# Patient Record
Sex: Female | Born: 1975
Health system: Southern US, Community
[De-identification: ages and names within clinical notes are randomized; demographics above are authoritative.]

## PROBLEM LIST (undated history)

## (undated) DIAGNOSIS — N7011 Chronic salpingitis: Secondary | ICD-10-CM

## (undated) DIAGNOSIS — F419 Anxiety disorder, unspecified: Secondary | ICD-10-CM

## (undated) DIAGNOSIS — K649 Unspecified hemorrhoids: Secondary | ICD-10-CM

## (undated) HISTORY — DX: Anxiety disorder, unspecified: F41.9

---

## 2004-08-14 HISTORY — PX: LEEP: SHX91

## 2006-06-17 ENCOUNTER — Emergency Department: Payer: Self-pay | Admitting: Emergency Medicine

## 2006-06-17 ENCOUNTER — Other Ambulatory Visit: Payer: Self-pay

## 2006-06-19 ENCOUNTER — Ambulatory Visit: Payer: Self-pay | Admitting: Internal Medicine

## 2006-06-20 ENCOUNTER — Ambulatory Visit: Payer: Self-pay | Admitting: Internal Medicine

## 2008-01-31 ENCOUNTER — Emergency Department: Payer: Self-pay | Admitting: Emergency Medicine

## 2012-11-17 ENCOUNTER — Ambulatory Visit: Payer: Self-pay

## 2012-11-17 LAB — CBC WITH DIFFERENTIAL/PLATELET
Basophil #: 0 10*3/uL (ref 0.0–0.1)
Eosinophil %: 0 %
HGB: 14 g/dL (ref 12.0–16.0)
Lymphocyte #: 0.4 10*3/uL — ABNORMAL LOW (ref 1.0–3.6)
Monocyte %: 2.9 %
Neutrophil #: 11.7 10*3/uL — ABNORMAL HIGH (ref 1.4–6.5)
RBC: 4.63 10*6/uL (ref 3.80–5.20)
WBC: 12.5 10*3/uL — ABNORMAL HIGH (ref 3.6–11.0)

## 2012-11-17 LAB — URINALYSIS, COMPLETE
Blood: NEGATIVE
Leukocyte Esterase: NEGATIVE
Nitrite: NEGATIVE
Ph: 6 (ref 4.5–8.0)
RBC,UR: NONE SEEN /HPF (ref 0–5)
Specific Gravity: >=1.03 (ref 1.003–1.030)

## 2012-11-17 LAB — COMPREHENSIVE METABOLIC PANEL
Albumin: 3.9 g/dL (ref 3.4–5.0)
Alkaline Phosphatase: 83 U/L (ref 50–136)
BUN: 18 mg/dL (ref 7–18)
Bilirubin,Total: 0.8 mg/dL (ref 0.2–1.0)
Calcium, Total: 8.4 mg/dL — ABNORMAL LOW (ref 8.5–10.1)
Co2: 27 mmol/L (ref 21–32)
Creatinine: 0.97 mg/dL (ref 0.60–1.30)
EGFR (Non-African Amer.): >60
Osmolality: 281 (ref 275–301)
Potassium: 3.2 mmol/L — ABNORMAL LOW (ref 3.5–5.1)
SGOT(AST): 24 U/L (ref 15–37)
SGPT (ALT): 24 U/L (ref 12–78)
Sodium: 140 mmol/L (ref 136–145)

## 2013-08-16 ENCOUNTER — Ambulatory Visit: Payer: Self-pay

## 2013-08-16 LAB — RAPID STREP-A WITH REFLX: Micro Text Report: NEGATIVE

## 2013-08-16 LAB — RAPID INFLUENZA A&B ANTIGENS

## 2013-08-19 LAB — BETA STREP CULTURE(ARMC)

## 2016-08-14 DIAGNOSIS — Z3183 Encounter for assisted reproductive fertility procedure cycle: Secondary | ICD-10-CM

## 2016-08-14 HISTORY — DX: Encounter for assisted reproductive fertility procedure cycle: Z31.83

## 2016-08-23 DIAGNOSIS — N7011 Chronic salpingitis: Secondary | ICD-10-CM | POA: Insufficient documentation

## 2016-09-06 DIAGNOSIS — M76821 Posterior tibial tendinitis, right leg: Secondary | ICD-10-CM | POA: Insufficient documentation

## 2016-09-06 DIAGNOSIS — M76822 Posterior tibial tendinitis, left leg: Secondary | ICD-10-CM

## 2016-12-12 HISTORY — PX: TUBAL LIGATION: SHX77

## 2016-12-12 HISTORY — PX: OTHER SURGICAL HISTORY: SHX169

## 2017-10-22 DIAGNOSIS — O09529 Supervision of elderly multigravida, unspecified trimester: Secondary | ICD-10-CM | POA: Diagnosis not present

## 2017-10-22 DIAGNOSIS — O200 Threatened abortion: Secondary | ICD-10-CM | POA: Diagnosis not present

## 2017-10-29 DIAGNOSIS — O0990 Supervision of high risk pregnancy, unspecified, unspecified trimester: Secondary | ICD-10-CM | POA: Diagnosis not present

## 2017-11-07 DIAGNOSIS — O021 Missed abortion: Secondary | ICD-10-CM | POA: Diagnosis not present

## 2017-11-19 DIAGNOSIS — Z6832 Body mass index (BMI) 32.0-32.9, adult: Secondary | ICD-10-CM | POA: Diagnosis not present

## 2017-11-19 DIAGNOSIS — O26851 Spotting complicating pregnancy, first trimester: Secondary | ICD-10-CM | POA: Diagnosis not present

## 2017-11-19 DIAGNOSIS — O09521 Supervision of elderly multigravida, first trimester: Secondary | ICD-10-CM | POA: Diagnosis not present

## 2017-11-19 DIAGNOSIS — O021 Missed abortion: Secondary | ICD-10-CM | POA: Diagnosis not present

## 2017-11-19 DIAGNOSIS — Z880 Allergy status to penicillin: Secondary | ICD-10-CM | POA: Diagnosis not present

## 2017-11-19 DIAGNOSIS — Z3A10 10 weeks gestation of pregnancy: Secondary | ICD-10-CM | POA: Diagnosis not present

## 2017-11-19 DIAGNOSIS — O039 Complete or unspecified spontaneous abortion without complication: Secondary | ICD-10-CM | POA: Diagnosis not present

## 2017-11-21 ENCOUNTER — Encounter: Payer: Self-pay | Admitting: Obstetrics and Gynecology

## 2017-12-07 ENCOUNTER — Encounter: Payer: Self-pay | Admitting: Obstetrics and Gynecology

## 2018-01-22 ENCOUNTER — Ambulatory Visit: Payer: 59 | Admitting: Internal Medicine

## 2018-01-22 ENCOUNTER — Encounter: Payer: Self-pay | Admitting: Internal Medicine

## 2018-01-22 VITALS — BP 100/76 | HR 83 | Resp 16 | Ht 62.0 in | Wt 178.2 lb

## 2018-01-22 DIAGNOSIS — Z124 Encounter for screening for malignant neoplasm of cervix: Secondary | ICD-10-CM

## 2018-01-22 DIAGNOSIS — Z319 Encounter for procreative management, unspecified: Secondary | ICD-10-CM

## 2018-01-22 DIAGNOSIS — F411 Generalized anxiety disorder: Secondary | ICD-10-CM | POA: Diagnosis not present

## 2018-01-22 DIAGNOSIS — Z0001 Encounter for general adult medical examination with abnormal findings: Secondary | ICD-10-CM

## 2018-01-22 DIAGNOSIS — F419 Anxiety disorder, unspecified: Secondary | ICD-10-CM | POA: Insufficient documentation

## 2018-01-22 DIAGNOSIS — Z1231 Encounter for screening mammogram for malignant neoplasm of breast: Secondary | ICD-10-CM | POA: Diagnosis not present

## 2018-01-22 DIAGNOSIS — R300 Dysuria: Secondary | ICD-10-CM

## 2018-01-22 DIAGNOSIS — Z1239 Encounter for other screening for malignant neoplasm of breast: Secondary | ICD-10-CM

## 2018-01-22 DIAGNOSIS — F410 Panic disorder [episodic paroxysmal anxiety] without agoraphobia: Secondary | ICD-10-CM | POA: Diagnosis not present

## 2018-01-22 NOTE — Progress Notes (Signed)
[facility] [place], [place] [address]  Internal MEDICINE  Office Visit Note  Patient Name: Emily Blake  [id number]  [id number]  Date of Service: 01/22/2018  Chief Complaint  Patient presents with  . Annual Exam    with pap smear   HPI Pt is here for routine health maintenance examination. She has been feeling well. Has baseline anxiety. She is trying to conceive through IVF. She did have miscarriage twice. Takes MOV. Has a healthy life style but stress at work   Current Medication: No outpatient encounter medications on file as of 01/22/2018.   No facility-administered encounter medications on file as of 01/22/2018.    Surgical History: Past Surgical History:  Procedure Laterality Date  . tubiligation  12/2016   Medical History: Past Medical History:  Diagnosis Date  . Anxiety    Family History: Family History  Problem Relation Age of Onset  . Diabetes Mother   . Hypertension Mother   . Hypertension Father   . Hypothyroidism Father    Review of Systems  Constitutional: Negative for chills, diaphoresis, fever, malaise/fatigue and weight loss.  HENT: Negative for nosebleeds and sinus pain.   Eyes: Negative for blurred vision, photophobia and pain.  Respiratory: Negative for hemoptysis, shortness of breath and wheezing.   Cardiovascular: Negative for chest pain, palpitations, orthopnea and leg swelling.  Gastrointestinal: Negative for abdominal pain, constipation, diarrhea and nausea.  Genitourinary: Negative for dysuria, frequency and hematuria.  Musculoskeletal: Negative for back pain, joint pain and myalgias.  Skin: Negative for itching and rash.  Neurological: Positive for headaches. Negative for tremors, sensory change and weakness.  Endo/Heme/Allergies: Negative for environmental allergies. Does not bruise/bleed easily.  Psychiatric/Behavioral: Negative for depression. The patient is nervous/anxious and has insomnia.    Vital  Signs: BP 100/76 (BP Location: Left Arm, Patient Position: Sitting)   Pulse 83   Resp 16   Ht 5\' 2"  (1.575 m)   Wt 178 lb 3.2 oz (80.8 kg)   SpO2 99%   BMI 32.59 kg/m   Physical Exam  Constitutional: She is oriented to person, place, and time. She appears well-developed and well-nourished. No distress.  HENT:  Head: Normocephalic and atraumatic.  Mouth/Throat: Oropharynx is clear and moist. No oropharyngeal exudate.  Eyes: Pupils are equal, round, and reactive to light. EOM are normal.  Neck: Normal range of motion. Neck supple. No JVD present. No tracheal deviation present. No thyromegaly present.  Cardiovascular: Normal rate, regular rhythm and normal heart sounds. Exam reveals no gallop and no friction rub.  No murmur heard. Pulmonary/Chest: Effort normal. No respiratory distress. She has no wheezes. She has no rales. She exhibits no tenderness. Right breast exhibits no inverted nipple and no mass. Left breast exhibits no inverted nipple and no mass. Breasts are symmetrical.    Abdominal: Soft. Bowel sounds are normal.  Genitourinary: Vagina normal and uterus normal.  Genitourinary Comments: Fragile cervical mucosa  Musculoskeletal: Normal range of motion.  Lymphadenopathy:    She has no cervical adenopathy.  Neurological: She is alert and oriented to person, place, and time. No cranial nerve deficit.  Skin: Skin is warm and dry. She is not diaphoretic.  Psychiatric: She has a normal mood and affect. Her behavior is normal. Judgment and thought content normal.   Assessment/Plan:  1. Encounter for general adult medical examination with abnormal findings Update all labs  - CBC with Differential/Platelet - Lipid Panel With LDL/HDL Ratio - TSH - T4, free - Comprehensive metabolic  panel  2. Infertility management Ordered  - Anti mullerian hormone - Prolactin  3. Generalized anxiety disorder with panic attacks Continue xanax prn   4. Dysuria - Urinalysis, Routine w  reflex microscopic  5. Screening for malignant neoplasm of cervix - Pap IG and HPV (high risk) DNA detection  6. Screening breast examination - MM DIGITAL SCREENING BILATERAL; Future  General Counseling: Emily Blake verbalizes understanding of the findings of todays visit and agrees with plan of treatment. I have discussed any further diagnostic evaluation that may be needed or ordered today. We also reviewed her medications today. she has been encouraged to call the office with any questions or concerns that should arise related to todays visit.  Orders Placed This Encounter  Procedures  . Urinalysis, Routine w reflex microscopic  . CBC with Differential/Platelet  . Lipid Panel With LDL/HDL Ratio  . TSH  . T4, free  . Comprehensive metabolic panel  . Anti mullerian hormone  . Prolactin     Time spent:20Minutes  Lavera Guise, MD  Internal Medicine

## 2018-01-23 ENCOUNTER — Encounter: Payer: Self-pay | Admitting: Internal Medicine

## 2018-01-23 DIAGNOSIS — F410 Panic disorder [episodic paroxysmal anxiety] without agoraphobia: Secondary | ICD-10-CM | POA: Diagnosis not present

## 2018-01-23 LAB — URINALYSIS, ROUTINE W REFLEX MICROSCOPIC
Bilirubin, UA: NEGATIVE
GLUCOSE, UA: NEGATIVE
Ketones, UA: NEGATIVE
Leukocytes, UA: NEGATIVE
Nitrite, UA: NEGATIVE
Protein, UA: NEGATIVE
RBC UA: NEGATIVE
Specific Gravity, UA: 1.021 (ref 1.005–1.030)
UUROB: 0.2 mg/dL (ref 0.2–1.0)
pH, UA: 8 — ABNORMAL HIGH (ref 5.0–7.5)

## 2018-01-23 MED FILL — ALPRAZolam 0.5 MG TABS: 0.5 | 7 days supply | Qty: 7 | Fill #0

## 2018-01-24 ENCOUNTER — Other Ambulatory Visit: Payer: Self-pay

## 2018-01-24 ENCOUNTER — Telehealth: Payer: Self-pay

## 2018-01-24 DIAGNOSIS — Z319 Encounter for procreative management, unspecified: Secondary | ICD-10-CM | POA: Diagnosis not present

## 2018-01-24 DIAGNOSIS — Z0001 Encounter for general adult medical examination with abnormal findings: Secondary | ICD-10-CM | POA: Diagnosis not present

## 2018-01-24 LAB — PAP IG AND HPV HIGH-RISK
HPV, high-risk: POSITIVE — AB
PAP SMEAR COMMENT: 0

## 2018-01-24 MED ORDER — ALPRAZOLAM 0.25 MG PO TABS: ORAL_TABLET | ORAL | 1 refills | Status: DC

## 2018-01-24 NOTE — Telephone Encounter (Signed)
Called in phar alprazolam 0.25mg  take 1 tab po daily prn 30 wirth 1 as per dr Humphrey Rolls and also advised pt that we send xanax /np

## 2018-01-28 LAB — CBC WITH DIFFERENTIAL/PLATELET
Basophils Absolute: 0 10*3/uL (ref 0.0–0.2)
Basos: 0 %
EOS (ABSOLUTE): 0.2 10*3/uL (ref 0.0–0.4)
EOS: 2 %
HEMATOCRIT: 40.7 % (ref 34.0–46.6)
Hemoglobin: 14 g/dL (ref 11.1–15.9)
IMMATURE GRANULOCYTES: 0 %
Immature Grans (Abs): 0 10*3/uL (ref 0.0–0.1)
LYMPHS ABS: 2.5 10*3/uL (ref 0.7–3.1)
Lymphs: 33 %
MCH: 30.7 pg (ref 26.6–33.0)
MCHC: 34.4 g/dL (ref 31.5–35.7)
MCV: 89 fL (ref 79–97)
MONOS ABS: 0.6 10*3/uL (ref 0.1–0.9)
Monocytes: 8 %
Neutrophils Absolute: 4.2 10*3/uL (ref 1.4–7.0)
Neutrophils: 57 %
PLATELETS: 261 10*3/uL (ref 150–450)
RBC: 4.56 x10E6/uL (ref 3.77–5.28)
RDW: 12.7 % (ref 12.3–15.4)
WBC: 7.5 10*3/uL (ref 3.4–10.8)

## 2018-01-28 LAB — COMPREHENSIVE METABOLIC PANEL
ALBUMIN: 4.4 g/dL (ref 3.5–5.5)
ALK PHOS: 86 IU/L (ref 39–117)
ALT: 15 IU/L (ref 0–32)
AST: 15 IU/L (ref 0–40)
Albumin/Globulin Ratio: 1.8 (ref 1.2–2.2)
BILIRUBIN TOTAL: 0.5 mg/dL (ref 0.0–1.2)
BUN/Creatinine Ratio: 12 (ref 9–23)
BUN: 12 mg/dL (ref 6–24)
CHLORIDE: 104 mmol/L (ref 96–106)
CO2: 23 mmol/L (ref 20–29)
CREATININE: 0.99 mg/dL (ref 0.57–1.00)
Calcium: 9.5 mg/dL (ref 8.7–10.2)
GFR calc Af Amer: 82 mL/min/{1.73_m2} (ref >59)
GFR calc non Af Amer: 72 mL/min/{1.73_m2} (ref >59)
GLUCOSE: 83 mg/dL (ref 65–99)
Globulin, Total: 2.4 g/dL (ref 1.5–4.5)
Potassium: 4.5 mmol/L (ref 3.5–5.2)
Sodium: 141 mmol/L (ref 134–144)
Total Protein: 6.8 g/dL (ref 6.0–8.5)

## 2018-01-28 LAB — T4, FREE: FREE T4: 1.01 ng/dL (ref 0.82–1.77)

## 2018-01-28 LAB — PROLACTIN: PROLACTIN: 21.6 ng/mL (ref 4.8–23.3)

## 2018-01-28 LAB — LIPID PANEL WITH LDL/HDL RATIO
Cholesterol, Total: 186 mg/dL (ref 100–199)
HDL: 67 mg/dL (ref >39)
LDL Calculated: 96 mg/dL (ref 0–99)
LDL/HDL RATIO: 1.4 ratio (ref 0.0–3.2)
Triglycerides: 114 mg/dL (ref 0–149)
VLDL Cholesterol Cal: 23 mg/dL (ref 5–40)

## 2018-01-28 LAB — ANTI MULLERIAN HORMONE: ANTI-MULLERIAN HORMONE (AMH): 2.07 ng/mL

## 2018-01-28 LAB — TSH: TSH: 2.24 u[IU]/mL (ref 0.450–4.500)

## 2018-02-25 DIAGNOSIS — Z319 Encounter for procreative management, unspecified: Secondary | ICD-10-CM | POA: Diagnosis not present

## 2018-02-25 DIAGNOSIS — N971 Female infertility of tubal origin: Secondary | ICD-10-CM | POA: Diagnosis not present

## 2018-02-25 DIAGNOSIS — E288 Other ovarian dysfunction: Secondary | ICD-10-CM | POA: Diagnosis not present

## 2018-03-01 DIAGNOSIS — E288 Other ovarian dysfunction: Secondary | ICD-10-CM | POA: Diagnosis not present

## 2018-03-11 DIAGNOSIS — N971 Female infertility of tubal origin: Secondary | ICD-10-CM | POA: Diagnosis not present

## 2018-03-11 DIAGNOSIS — Z3141 Encounter for fertility testing: Secondary | ICD-10-CM | POA: Diagnosis not present

## 2018-03-11 DIAGNOSIS — E288 Other ovarian dysfunction: Secondary | ICD-10-CM | POA: Diagnosis not present

## 2018-03-12 DIAGNOSIS — E288 Other ovarian dysfunction: Secondary | ICD-10-CM | POA: Diagnosis not present

## 2018-03-12 DIAGNOSIS — N971 Female infertility of tubal origin: Secondary | ICD-10-CM | POA: Diagnosis not present

## 2018-03-12 DIAGNOSIS — Z319 Encounter for procreative management, unspecified: Secondary | ICD-10-CM | POA: Diagnosis not present

## 2018-03-12 DIAGNOSIS — Z113 Encounter for screening for infections with a predominantly sexual mode of transmission: Secondary | ICD-10-CM | POA: Diagnosis not present

## 2018-03-12 DIAGNOSIS — Z31430 Encounter of female for testing for genetic disease carrier status for procreative management: Secondary | ICD-10-CM | POA: Diagnosis not present

## 2018-03-14 MED FILL — BD 3 ML SYRINGE 18GX1-1/2: 18G X 1-1/2 | 30 days supply | Qty: 60 | Fill #0

## 2018-03-14 MED FILL — NOVAREL 5000 UNIT SOLR: 5000 | 1 days supply | Qty: 2 | Fill #0

## 2018-03-14 MED FILL — BD 3 ML SYRINGE 18GX1-1/2": 18G X 1-1/2 | 30 days supply | Qty: 60 | Fill #0

## 2018-03-14 MED FILL — MENOPUR 75 UNIT VIAL: 75 | 13 days supply | Qty: 25 | Fill #0

## 2018-03-14 MED FILL — GONAL-F 1,050 UNITS VIAL: 1050 | 11 days supply | Qty: 3 | Fill #0

## 2018-03-14 MED FILL — CETROTIDE 0.25 MG KIT: 0.25 | 5 days supply | Qty: 5 | Fill #0

## 2018-03-14 MED FILL — BD NEEDLES 30GX0.5": 30G X 1/2" | 30 days supply | Qty: 30 | Fill #0

## 2018-03-14 MED FILL — ESTRADIOL 0.1 MG PATCH: 0.1 | 30 days supply | Qty: 8 | Fill #0

## 2018-03-14 MED FILL — BD NEEDLES 22GX1.5: 22G X 1-1/2 | 30 days supply | Qty: 30 | Fill #0

## 2018-03-14 MED FILL — DOXYCYCLINE HYCLATE 100 MG: 100 | 20 days supply | Qty: 40 | Fill #0

## 2018-03-14 MED FILL — PROGESTERONE OIL 50 MG/ML V: 50 | 30 days supply | Qty: 30 | Fill #0

## 2018-03-14 MED FILL — SHARPS COLLECTOR 1.4QT: 1 days supply | Qty: 1 | Fill #0

## 2018-03-14 MED FILL — METHYLPREDNISOLONE 4 MG TAB: 4 | 4 days supply | Qty: 16 | Fill #0

## 2018-03-14 MED FILL — ESTRADIOL 2 MG TABLET: 2 | 30 days supply | Qty: 60 | Fill #0

## 2018-03-14 MED FILL — ULTICARE ALCOHOL SWABS PADS: 30 days supply | Qty: 100 | Fill #0

## 2018-03-14 MED FILL — BD NEEDLES 22GX1.5": 22G X 1-1/2 | 30 days supply | Qty: 30 | Fill #0

## 2018-03-14 MED FILL — BD NEEDLES 30GX0.5: 30G X 1/2" | 30 days supply | Qty: 30 | Fill #0

## 2018-03-22 DIAGNOSIS — Z3183 Encounter for assisted reproductive fertility procedure cycle: Secondary | ICD-10-CM | POA: Diagnosis not present

## 2018-03-26 ENCOUNTER — Emergency Department (HOSPITAL_BASED_OUTPATIENT_CLINIC_OR_DEPARTMENT_OTHER): Payer: 59

## 2018-03-26 ENCOUNTER — Other Ambulatory Visit: Payer: Self-pay

## 2018-03-26 ENCOUNTER — Emergency Department (HOSPITAL_BASED_OUTPATIENT_CLINIC_OR_DEPARTMENT_OTHER)
Admission: EM | Admit: 2018-03-26 | Discharge: 2018-03-26 | Disposition: A | Discharge status: Home / Self Care | Payer: 59 | Attending: Emergency Medicine | Admitting: Emergency Medicine

## 2018-03-26 ENCOUNTER — Encounter (HOSPITAL_BASED_OUTPATIENT_CLINIC_OR_DEPARTMENT_OTHER): Payer: Self-pay | Admitting: Emergency Medicine

## 2018-03-26 DIAGNOSIS — R1031 Right lower quadrant pain: Secondary | ICD-10-CM | POA: Insufficient documentation

## 2018-03-26 DIAGNOSIS — R112 Nausea with vomiting, unspecified: Secondary | ICD-10-CM | POA: Diagnosis not present

## 2018-03-26 DIAGNOSIS — R1110 Vomiting, unspecified: Secondary | ICD-10-CM | POA: Diagnosis not present

## 2018-03-26 HISTORY — DX: Chronic salpingitis: N70.11

## 2018-03-26 LAB — COMPREHENSIVE METABOLIC PANEL
ALT: 16 U/L (ref 0–44)
AST: 19 U/L (ref 15–41)
Albumin: 4.1 g/dL (ref 3.5–5.0)
Alkaline Phosphatase: 68 U/L (ref 38–126)
Anion gap: 8 (ref 5–15)
BILIRUBIN TOTAL: 0.5 mg/dL (ref 0.3–1.2)
BUN: 13 mg/dL (ref 6–20)
CALCIUM: 8.6 mg/dL — AB (ref 8.9–10.3)
CHLORIDE: 103 mmol/L (ref 98–111)
CO2: 26 mmol/L (ref 22–32)
Creatinine, Ser: 0.82 mg/dL (ref 0.44–1.00)
Glucose, Bld: 95 mg/dL (ref 70–99)
Potassium: 3.9 mmol/L (ref 3.5–5.1)
Sodium: 137 mmol/L (ref 135–145)
TOTAL PROTEIN: 7.1 g/dL (ref 6.5–8.1)

## 2018-03-26 LAB — CBC WITH DIFFERENTIAL/PLATELET
BASOS PCT: 0 %
Basophils Absolute: 0 10*3/uL (ref 0.0–0.1)
EOS ABS: 0 10*3/uL (ref 0.0–0.7)
EOS PCT: 0 %
HCT: 40.3 % (ref 36.0–46.0)
Hemoglobin: 14.1 g/dL (ref 12.0–15.0)
LYMPHS PCT: 12 %
Lymphs Abs: 1.1 10*3/uL (ref 0.7–4.0)
MCH: 31.1 pg (ref 26.0–34.0)
MCHC: 35 g/dL (ref 30.0–36.0)
MCV: 89 fL (ref 78.0–100.0)
MONO ABS: 0.5 10*3/uL (ref 0.1–1.0)
Monocytes Relative: 5 %
Neutro Abs: 7.9 10*3/uL — ABNORMAL HIGH (ref 1.7–7.7)
Neutrophils Relative %: 83 %
PLATELETS: 218 10*3/uL (ref 150–400)
RBC: 4.53 MIL/uL (ref 3.87–5.11)
RDW: 11.9 % (ref 11.5–15.5)
WBC: 9.5 10*3/uL (ref 4.0–10.5)

## 2018-03-26 LAB — URINALYSIS, ROUTINE W REFLEX MICROSCOPIC
BILIRUBIN URINE: NEGATIVE
GLUCOSE, UA: NEGATIVE mg/dL
Ketones, ur: 40 mg/dL — AB
Leukocytes, UA: NEGATIVE
Nitrite: NEGATIVE
Protein, ur: NEGATIVE mg/dL
Specific Gravity, Urine: <1.005 — ABNORMAL LOW (ref 1.005–1.030)
pH: 7 (ref 5.0–8.0)

## 2018-03-26 LAB — URINALYSIS, MICROSCOPIC (REFLEX)

## 2018-03-26 LAB — LIPASE, BLOOD: LIPASE: 30 U/L (ref 11–51)

## 2018-03-26 LAB — PREGNANCY, URINE: PREG TEST UR: NEGATIVE

## 2018-03-26 MED ORDER — KETOROLAC TROMETHAMINE 30 MG/ML IJ SOLN
30.0000 mg | Freq: Once | INTRAMUSCULAR | Status: AC
  Administered 2018-03-26: 30 mg via INTRAVENOUS
  Filled 2018-03-26: qty 1

## 2018-03-26 MED ORDER — SODIUM CHLORIDE 0.9 % IV BOLUS
500.0000 mL | Freq: Once | INTRAVENOUS | Status: AC
  Administered 2018-03-26: 500 mL via INTRAVENOUS

## 2018-03-26 MED ORDER — ONDANSETRON HCL 4 MG/2ML IJ SOLN
4.0000 mg | Freq: Once | INTRAMUSCULAR | Status: AC
  Administered 2018-03-26: 4 mg via INTRAVENOUS
  Filled 2018-03-26: qty 2

## 2018-03-26 MED ORDER — IOPAMIDOL (ISOVUE-300) INJECTION 61%
100.0000 mL | Freq: Once | INTRAVENOUS | Status: AC | PRN
  Administered 2018-03-26: 100 mL via INTRAVENOUS

## 2018-03-26 MED ORDER — TRAMADOL HCL 50 MG PO TABS: 50.0000 mg | ORAL_TABLET | Freq: Four times a day (QID) | ORAL | 0 refills | Status: DC | PRN

## 2018-03-26 MED FILL — traMADol HCL 50 MG TABS: 50 | 3 days supply | Qty: 12 | Fill #0

## 2018-03-26 NOTE — ED Triage Notes (Signed)
RLQ abdominal pain since this am.  Some N/V.  Pt states she is about to start her period but pain is sharp and feels different.

## 2018-03-26 NOTE — ED Provider Notes (Signed)
Emergency Department Provider Note   I have reviewed the triage vital signs and the nursing notes.   HISTORY  Chief Complaint Abdominal Pain   HPI Lezli D Krupka is a 42 y.o. female with PMH of hydrosalpinx s/p tubal ligation presents to the ED with severe right-sided abdominal pain which began this morning.  Patient states the pain began more toward the middle and is since moved more to the right lower quadrant.  She woke up this morning and felt like she would feel better after a bowel movement but did not.  She had some loose stool but not exactly diarrhea.  Shortly after the bowel movement she began having multiple episodes of watery, nonbloody vomiting.  She denies any fevers or chills.  She states that she is due to start her menstrual cycle but this feels very atypical for her menstrual cycle pain.  Pain is slightly improved with pressing in the area.  No other modifying factors.  Past Medical History:  Diagnosis Date  . Anxiety   . Hydrosalpinx     Patient Active Problem List   Diagnosis Date Noted  . Anxiety 01/22/2018  . Posterior tibial tendon dysfunction (PTTD) of both lower extremities 09/06/2016  . Hydrosalpinx 08/23/2016    Past Surgical History:  Procedure Laterality Date  . tubiligation  12/2016    Allergies Amoxicillin  Family History  Problem Relation Age of Onset  . Diabetes Mother   . Hypertension Mother   . Hypertension Father   . Hypothyroidism Father     Social History Social History   Tobacco Use  . Smoking status: Never Smoker  . Smokeless tobacco: Never Used  Substance Use Topics  . Alcohol use: Not Currently    Frequency: Never  . Drug use: Not Currently    Review of Systems  Constitutional: No fever/chills Eyes: No visual changes. ENT: No sore throat. Cardiovascular: Denies chest pain. Respiratory: Denies shortness of breath. Gastrointestinal: Positive RLQ abdominal pain.  No nausea, no vomiting.  No diarrhea.  No  constipation. Genitourinary: Negative for dysuria. Musculoskeletal: Negative for back pain. Skin: Negative for rash. Neurological: Negative for headaches, focal weakness or numbness.  10-point ROS otherwise negative.  ____________________________________________   PHYSICAL EXAM:  VITAL SIGNS: ED Triage Vitals  Enc Vitals Group     BP 03/26/18 1050 (!) 125/94     Pulse Rate 03/26/18 1050 74     Resp 03/26/18 1050 18     Temp 03/26/18 1050 98.2 F (36.8 C)     Temp Source 03/26/18 1050 Oral     SpO2 03/26/18 1050 100 %     Weight 03/26/18 1050 175 lb (79.4 kg)     Height 03/26/18 1050 5\' 2"  (1.575 m)     Pain Score 03/26/18 1115 8   Constitutional: Alert and oriented. Well appearing and in no acute distress. Eyes: Conjunctivae are normal.  Head: Atraumatic. Nose: No congestion/rhinnorhea. Mouth/Throat: Mucous membranes are moist.  Oropharynx non-erythematous. Neck: No stridor.  Cardiovascular: Normal rate, regular rhythm. Good peripheral circulation. Grossly normal heart sounds.   Respiratory: Normal respiratory effort.  No retractions. Lungs CTAB. Gastrointestinal: Soft with focal RLQ tenderness. No rebound or guarding. No distention.  Musculoskeletal: No lower extremity tenderness nor edema. No gross deformities of extremities. Neurologic:  Normal speech and language. No gross focal neurologic deficits are appreciated.  Skin:  Skin is warm, dry and intact. No rash noted.  ____________________________________________   LABS (all labs ordered are listed, but only abnormal results  are displayed)  Labs Reviewed  URINALYSIS, ROUTINE W REFLEX MICROSCOPIC - Abnormal; Notable for the following components:      Result Value   Specific Gravity, Urine <1.005 (*)    Hgb urine dipstick TRACE (*)    Ketones, ur 40 (*)    All other components within normal limits  COMPREHENSIVE METABOLIC PANEL - Abnormal; Notable for the following components:   Calcium 8.6 (*)    All other  components within normal limits  CBC WITH DIFFERENTIAL/PLATELET - Abnormal; Notable for the following components:   Neutro Abs 7.9 (*)    All other components within normal limits  URINALYSIS, MICROSCOPIC (REFLEX) - Abnormal; Notable for the following components:   Bacteria, UA RARE (*)    All other components within normal limits  PREGNANCY, URINE  LIPASE, BLOOD   ____________________________________________  RADIOLOGY  Ct Abdomen Pelvis W Contrast  Result Date: 03/26/2018 CLINICAL DATA:  Right lower quadrant pain with nausea vomiting. EXAM: CT ABDOMEN AND PELVIS WITH CONTRAST TECHNIQUE: Multidetector CT imaging of the abdomen and pelvis was performed using the standard protocol following bolus administration of intravenous contrast. CONTRAST:  145mL ISOVUE-300 IOPAMIDOL (ISOVUE-300) INJECTION 61% COMPARISON:  None. FINDINGS: Lower chest: Unremarkable. Hepatobiliary: Small area of low attenuation in the anterior liver, adjacent to the falciform ligament, is in a characteristic location for focal fatty deposition. There is no evidence for gallstones, gallbladder wall thickening, or pericholecystic fluid. No intrahepatic or extrahepatic biliary dilation. Pancreas: No focal mass lesion. No dilatation of the main duct. No intraparenchymal cyst. No peripancreatic edema. Spleen: No splenomegaly. No focal mass lesion. Adrenals/Urinary Tract: No adrenal nodule or mass. Kidneys unremarkable. No evidence for hydroureter. The urinary bladder appears normal for the degree of distention. Stomach/Bowel: Stomach is nondistended. No gastric wall thickening. No evidence of outlet obstruction. Duodenum is normally positioned as is the ligament of Treitz. No small bowel wall thickening. No small bowel dilatation. The terminal ileum is normal. Appendiceal diameter is upper normal at 6 mm. Appendiceal lumen is largely air-filled. There is some trace fluid around the appendix. No gross colonic mass. No colonic wall  thickening. No substantial diverticular change. Vascular/Lymphatic: No abdominal aortic aneurysm. There is no gastrohepatic or hepatoduodenal ligament lymphadenopathy. No intraperitoneal or retroperitoneal lymphadenopathy. No pelvic sidewall lymphadenopathy. Reproductive: Uterus unremarkable. Fluid-filled tubular structures are identified in the posterior adnexal spaces bilaterally, measuring up to 2.0 cm diameter on the right. No gross adnexal mass. Other: Small volume free fluid noted in the cul-de-sac. Musculoskeletal: No worrisome lytic or sclerotic osseous abnormality. IMPRESSION: 1. Fluid-filled tubular structures in the posterior adnexal spaces bilaterally are most likely dilated fallopian tubes. Imaging features compatible with bilateral hydrosalpinx and superinfection (pyosalpinx) could have this appearance. 2. Small volume intraperitoneal free fluid in the cul-de-sac. There is trace fluid layering along the appendix, but the appendix is otherwise normal in appearance. Electronically Signed   By: Misty Stanley M.D.   On: 03/26/2018 13:07    ____________________________________________   PROCEDURES  Procedure(s) performed:   Procedures   ____________________________________________   INITIAL IMPRESSION / ASSESSMENT AND PLAN / ED COURSE  Pertinent labs & imaging results that were available during my care of the patient were reviewed by me and considered in my medical decision making (see chart for details).  Patient presents to the emergency department for evaluation of right lower quadrant abdominal pain.  She has tenderness in the right mid abdomen and right lower quadrant.  Labs and UA pending.  Will give IV fluids, pain medication,  and follow CT abdomen pelvis to rule out acute appendicitis  CT reviewed with patient. Has known hydrosalpinx in the past. Discussed specific findings and advised US of the pelvis. She spoke with her IVF physician who would like to skip the [place] today. She  can be seen tomorrow AM for [place] and possible drainage PRN. No fever. No abx at this time. Plan for pain medication and OB f/u in the AM.   At this time, I do not feel there is any life-threatening condition present. I have reviewed and discussed all results (EKG, imaging, lab, urine as appropriate), exam findings with patient. I have reviewed nursing notes and appropriate previous records.  I feel the patient is safe to be discharged home without further emergent workup. Discussed usual and customary return precautions. Patient and family (if present) verbalize understanding and are comfortable with this plan.  Patient will follow-up with their primary care provider. If they do not have a primary care provider, information for follow-up has been provided to them. All questions have been answered.  ____________________________________________  FINAL CLINICAL IMPRESSION(S) / ED DIAGNOSES  Final diagnoses:  Right lower quadrant abdominal pain     MEDICATIONS GIVEN DURING THIS VISIT:  Medications  sodium chloride 0.9 % bolus 500 mL (0 mLs Intravenous Stopped 03/26/18 1338)  ondansetron (ZOFRAN) injection 4 mg (4 mg Intravenous Given 03/26/18 1213)  ketorolac (TORADOL) 30 MG/ML injection 30 mg (30 mg Intravenous Given 03/26/18 1213)  iopamidol (ISOVUE-300) 61 % injection 100 mL (100 mLs Intravenous Contrast Given 03/26/18 1239)     NEW OUTPATIENT MEDICATIONS STARTED DURING THIS VISIT:  Discharge Medication List as of 03/26/2018  1:50 PM    START taking these medications   Details  traMADol (ULTRAM) 50 MG tablet Take 1 tablet (50 mg total) by mouth every 6 (six) hours as needed., Starting Tue 03/26/2018, Print        Note:  This document was prepared using Dragon voice recognition software and may include unintentional dictation errors.  Nanda Quinton, MD Emergency Medicine    Decie Verne, W[facility], MD 03/26/18 [phone]

## 2018-03-26 NOTE — Discharge Instructions (Signed)
You have been seen in the Emergency Department (ED) for abdominal pain.  Your evaluation did not identify a clear cause of your symptoms but was generally reassuring.  See your IVF team in the morning for further treatment of the enlarged fallopian tubes and possible drainage.   Please follow up as instructed above regarding todays emergent visit and the symptoms that are bothering you.  Return to the ED if your abdominal pain worsens or fails to improve, you develop bloody vomiting, bloody diarrhea, you are unable to tolerate fluids due to vomiting, fever greater than 101, or other symptoms that concern you.

## 2018-03-26 NOTE — ED Notes (Signed)
Pt deciding that she will wait until tomorrow for ultrasound at her MD office appt.  Dr. Laverta Baltimore notified.

## 2018-03-27 DIAGNOSIS — Z3183 Encounter for assisted reproductive fertility procedure cycle: Secondary | ICD-10-CM | POA: Diagnosis not present

## 2018-04-01 DIAGNOSIS — Z3183 Encounter for assisted reproductive fertility procedure cycle: Secondary | ICD-10-CM | POA: Diagnosis not present

## 2018-04-03 DIAGNOSIS — Z3183 Encounter for assisted reproductive fertility procedure cycle: Secondary | ICD-10-CM | POA: Diagnosis not present

## 2018-04-09 DIAGNOSIS — E288 Other ovarian dysfunction: Secondary | ICD-10-CM | POA: Diagnosis not present

## 2018-05-13 ENCOUNTER — Encounter: Payer: Self-pay | Admitting: Nurse Practitioner

## 2018-05-14 MED FILL — CETROTIDE 0.25 MG KIT: 0.25 | 28 days supply | Qty: 6 | Fill #0

## 2018-05-21 DIAGNOSIS — H52222 Regular astigmatism, left eye: Secondary | ICD-10-CM | POA: Diagnosis not present

## 2018-05-21 DIAGNOSIS — H5202 Hypermetropia, left eye: Secondary | ICD-10-CM | POA: Diagnosis not present

## 2018-05-21 DIAGNOSIS — H524 Presbyopia: Secondary | ICD-10-CM | POA: Diagnosis not present

## 2018-05-22 DIAGNOSIS — N978 Female infertility of other origin: Secondary | ICD-10-CM | POA: Diagnosis not present

## 2018-05-22 DIAGNOSIS — E288 Other ovarian dysfunction: Secondary | ICD-10-CM | POA: Diagnosis not present

## 2018-05-26 DIAGNOSIS — N978 Female infertility of other origin: Secondary | ICD-10-CM | POA: Diagnosis not present

## 2018-05-28 DIAGNOSIS — E288 Other ovarian dysfunction: Secondary | ICD-10-CM | POA: Diagnosis not present

## 2018-05-28 DIAGNOSIS — N978 Female infertility of other origin: Secondary | ICD-10-CM | POA: Diagnosis not present

## 2018-05-28 DIAGNOSIS — Z3183 Encounter for assisted reproductive fertility procedure cycle: Secondary | ICD-10-CM | POA: Diagnosis not present

## 2018-05-29 DIAGNOSIS — Z3183 Encounter for assisted reproductive fertility procedure cycle: Secondary | ICD-10-CM | POA: Diagnosis not present

## 2018-05-30 DIAGNOSIS — Z3183 Encounter for assisted reproductive fertility procedure cycle: Secondary | ICD-10-CM | POA: Diagnosis not present

## 2018-05-30 DIAGNOSIS — N978 Female infertility of other origin: Secondary | ICD-10-CM | POA: Diagnosis not present

## 2018-06-01 DIAGNOSIS — Z3183 Encounter for assisted reproductive fertility procedure cycle: Secondary | ICD-10-CM | POA: Diagnosis not present

## 2018-06-01 DIAGNOSIS — N978 Female infertility of other origin: Secondary | ICD-10-CM | POA: Diagnosis not present

## 2018-06-03 DIAGNOSIS — Z3183 Encounter for assisted reproductive fertility procedure cycle: Secondary | ICD-10-CM | POA: Diagnosis not present

## 2018-06-03 DIAGNOSIS — N978 Female infertility of other origin: Secondary | ICD-10-CM | POA: Diagnosis not present

## 2018-06-05 DIAGNOSIS — Z3183 Encounter for assisted reproductive fertility procedure cycle: Secondary | ICD-10-CM | POA: Diagnosis not present

## 2018-06-05 DIAGNOSIS — N978 Female infertility of other origin: Secondary | ICD-10-CM | POA: Diagnosis not present

## 2018-06-06 DIAGNOSIS — Z3183 Encounter for assisted reproductive fertility procedure cycle: Secondary | ICD-10-CM | POA: Diagnosis not present

## 2018-06-07 DIAGNOSIS — N978 Female infertility of other origin: Secondary | ICD-10-CM | POA: Diagnosis not present

## 2018-07-15 MED FILL — METFORMIN HCL ER 750 MG TAB: 750 | 30 days supply | Qty: 60 | Fill #0

## 2018-08-12 DIAGNOSIS — M7061 Trochanteric bursitis, right hip: Secondary | ICD-10-CM | POA: Diagnosis not present

## 2018-08-20 ENCOUNTER — Encounter: Payer: Self-pay | Admitting: Internal Medicine

## 2018-08-21 MED FILL — BD SYRINGE 3 ML: 3 ML | 15 days supply | Qty: 15 | Fill #0

## 2018-08-21 MED FILL — MENOPUR 75 UNIT VIAL: 75 | 10 days supply | Qty: 10 | Fill #0

## 2018-08-21 MED FILL — GONAL-F RFF REDI-JECT 900 U: 900 | 28 days supply | Qty: 3 | Fill #0

## 2018-08-21 MED FILL — BD ECLIPSE NEEDLE 25GX1: 25G X 1" | 15 days supply | Qty: 15 | Fill #0

## 2018-08-21 MED FILL — CETROTIDE 0.25 MG KIT: 0.25 | 4 days supply | Qty: 4 | Fill #0

## 2018-08-21 MED FILL — BD ECLIPSE NEEDLE 25GX1": 25G X 1" | 15 days supply | Qty: 15 | Fill #0

## 2018-08-26 ENCOUNTER — Encounter: Payer: Self-pay | Admitting: Nurse Practitioner

## 2018-08-26 ENCOUNTER — Ambulatory Visit: Payer: 59 | Admitting: Nurse Practitioner

## 2018-08-26 VITALS — BP 133/79 | HR 76 | Resp 16 | Ht 62.0 in | Wt 175.0 lb

## 2018-08-26 DIAGNOSIS — F411 Generalized anxiety disorder: Secondary | ICD-10-CM | POA: Diagnosis not present

## 2018-08-26 DIAGNOSIS — R5383 Other fatigue: Secondary | ICD-10-CM

## 2018-08-26 DIAGNOSIS — F410 Panic disorder [episodic paroxysmal anxiety] without agoraphobia: Secondary | ICD-10-CM

## 2018-08-26 MED ORDER — ALPRAZOLAM 0.5 MG PO TABS: ORAL_TABLET | ORAL | 3 refills | Status: DC

## 2018-08-26 NOTE — Progress Notes (Signed)
[facility] [place], [place] [address]  Internal MEDICINE  Office Visit Note  Patient Name: Emily Blake  [id number]  [id number]  Date of Service: 09/01/2018  Chief Complaint  Patient presents with  . Medical Management of Chronic Issues    discuss medications    The patient is here for follow up visit. She is complaining of increased anxiety. Anxiety is interfering with her sleep. She and her husband have been going through IVF treatments. She has had successful pregnancies, however has lost the fetus each time. They plan to try at least once more time before they stop trying. She used to take alprazolam at bedtime to help her sleep. She has not been taking it because of fear it would interfere with pregnancy. She would like to begin taking it again lto help her get good sleep. She will stop taking it if she has a positive pregnancy test.       Current Medication: Outpatient Encounter Medications as of 08/26/2018  Medication Sig  . metFORMIN (GLUCOPHAGE-XR) 750 MG 24 hr tablet Take 750 mg by mouth daily with breakfast. Take morning and night  . ALPRAZolam (XANAX) 0.5 MG tablet Take 1 tablet po BID prn anxiety.  Marland Kitchen estradiol (VIVELLE-DOT) 0.1 MG/24HR patch Place 1 patch onto the skin every 3 (three) days.  . traMADol (ULTRAM) 50 MG tablet Take 1 tablet (50 mg total) by mouth every 6 (six) hours as needed. (Patient not taking: Reported on 08/26/2018)  . [DISCONTINUED] ALPRAZolam (XANAX) 0.25 MG tablet Take  1 tab po qd PRN (Patient not taking: Reported on 08/26/2018)  . [DISCONTINUED] ALPRAZolam (XANAX) 0.5 MG tablet Take 1 tablet po BID prn anxiety.   No facility-administered encounter medications on file as of 08/26/2018.     Surgical History: Past Surgical History:  Procedure Laterality Date  . tubiligation  12/2016    Medical History: Past Medical History:  Diagnosis Date  . Anxiety   . Hydrosalpinx     Family History: Family History   Problem Relation Age of Onset  . Diabetes Mother   . Hypertension Mother   . Hypertension Father   . Hypothyroidism Father     Social History   Socioeconomic History  . Marital status: Single    Spouse name: Not on file  . Number of children: Not on file  . Years of education: Not on file  . Highest education level: Not on file  Occupational History  . Not on file  Social Needs  . Financial resource strain: Not on file  . Food insecurity:    Worry: Not on file    Inability: Not on file  . Transportation needs:    Medical: Not on file    Non-medical: Not on file  Tobacco Use  . Smoking status: Never Smoker  . Smokeless tobacco: Never Used  Substance and Sexual Activity  . Alcohol use: Not Currently    Frequency: Never  . Drug use: Not Currently  . Sexual activity: Not on file  Lifestyle  . Physical activity:    Days per week: Not on file    Minutes per session: Not on file  . Stress: Not on file  Relationships  . Social connections:    Talks on phone: Not on file    Gets together: Not on file    Attends religious service: Not on file    Active member of club or organization: Not on file    Attends meetings of clubs  or organizations: Not on file    Relationship status: Not on file  . Intimate partner violence:    Fear of current or ex partner: Not on file    Emotionally abused: Not on file    Physically abused: Not on file    Forced sexual activity: Not on file  Other Topics Concern  . Not on file  Social History Narrative  . Not on file      Review of Systems  Constitutional: Positive for fatigue. Negative for chills and unexpected weight change.  HENT: Negative for congestion, postnasal drip, rhinorrhea, sneezing and sore throat.   Respiratory: Negative for cough, chest tightness, shortness of breath and wheezing.   Cardiovascular: Negative for chest pain and palpitations.  Gastrointestinal: Negative for abdominal pain, constipation, diarrhea, nausea  and vomiting.  Endocrine: Negative for cold intolerance, heat intolerance, polydipsia and polyuria.  Musculoskeletal: Negative for arthralgias, back pain, joint swelling and neck pain.  Skin: Negative for rash.  Allergic/Immunologic: Negative for environmental allergies.  Neurological: Negative for dizziness, tremors, numbness and headaches.  Hematological: Negative for adenopathy. Does not bruise/bleed easily.  Psychiatric/Behavioral: Negative for behavioral problems (Depression), sleep disturbance and suicidal ideas. The patient is nervous/anxious.     Today's Vitals   08/26/18 1545  BP: 133/79  Pulse: 76  Resp: 16  SpO2: 98%  Weight: 175 lb (79.4 kg)  Height: 5\' 2"  (1.575 m)     Physical Exam Vitals signs and nursing note reviewed.  Constitutional:      General: She is not in acute distress.    Appearance: Normal appearance. She is well-developed. She is not diaphoretic.  HENT:     Head: Normocephalic and atraumatic.     Mouth/Throat:     Pharynx: No oropharyngeal exudate.  Eyes:     Pupils: Pupils are equal, round, and reactive to light.  Neck:     Musculoskeletal: Normal range of motion and neck supple. No muscular tenderness.     Thyroid: No thyromegaly.     Vascular: No JVD.     Trachea: No tracheal deviation.  Cardiovascular:     Rate and Rhythm: Normal rate and regular rhythm.     Heart sounds: Normal heart sounds. No murmur. No friction rub. No gallop.   Pulmonary:     Effort: Pulmonary effort is normal. No respiratory distress.     Breath sounds: Normal breath sounds. No wheezing or rales.  Chest:     Chest wall: No tenderness.  Abdominal:     General: Bowel sounds are normal.     Palpations: Abdomen is soft.  Musculoskeletal: Normal range of motion.  Lymphadenopathy:     Cervical: No cervical adenopathy.  Skin:    General: Skin is warm and dry.  Neurological:     Mental Status: She is alert and oriented to person, place, and time.     Cranial Nerves:  No cranial nerve deficit.  Psychiatric:        Attention and Perception: Attention and perception normal.        Mood and Affect: Mood is anxious. Affect is tearful.        Speech: Speech normal.        Behavior: Behavior normal. Behavior is cooperative.        Thought Content: Thought content normal.        Cognition and Memory: Cognition and memory normal.        Judgment: Judgment normal.    Assessment/Plan: 1. Fatigue, unspecified type Likely  from anxiety/depression interfering with sleep. Will monitor.   2. Generalized anxiety disorder with panic attacks Start back alprazolam 0.5mg  twice daily as needed for acute anxiety, especially at night. Advised she use this with caution as it may cause dizziness or drowsiness. Advised her to stop taking immediately if pregnancy occurred.  - ALPRAZolam (XANAX) 0.5 MG tablet; Take 1 tablet po BID prn anxiety.  Dispense: 60 tablet; Refill: 3  General Counseling: Arlet verbalizes understanding of the findings of todays visit and agrees with plan of treatment. I have discussed any further diagnostic evaluation that may be needed or ordered today. We also reviewed her medications today. she has been encouraged to call the office with any questions or concerns that should arise related to todays visit.  This patient was seen by [place] with Dr Lavera Guise as a part of collaborative care agreement  Meds ordered this encounter  Medications  . DISCONTD: ALPRAZolam (XANAX) 0.5 MG tablet    Sig: Take 1 tablet po BID prn anxiety.    Dispense:  60 tablet    Refill:  3    Order Specific Question:   Supervising Provider    Answer:   Lavera Guise [[id number]]  . ALPRAZolam (XANAX) 0.5 MG tablet    Sig: Take 1 tablet po BID prn anxiety.    Dispense:  60 tablet    Refill:  3    Order Specific Question:   Supervising Provider    Answer:   Lavera Guise [[id number]]    Time spent: 50 Minutes      Dr Lavera Guise Internal medicine

## 2018-08-30 DIAGNOSIS — N978 Female infertility of other origin: Secondary | ICD-10-CM | POA: Diagnosis not present

## 2018-08-30 MED FILL — LEUPROLIDE 2WK 1 MG/0.2 ML: 1 | 14 days supply | Qty: 1 | Fill #0

## 2018-09-01 DIAGNOSIS — F410 Panic disorder [episodic paroxysmal anxiety] without agoraphobia: Secondary | ICD-10-CM | POA: Insufficient documentation

## 2018-09-01 DIAGNOSIS — R5383 Other fatigue: Secondary | ICD-10-CM | POA: Insufficient documentation

## 2018-09-01 DIAGNOSIS — F411 Generalized anxiety disorder: Secondary | ICD-10-CM | POA: Insufficient documentation

## 2018-09-02 DIAGNOSIS — N978 Female infertility of other origin: Secondary | ICD-10-CM | POA: Diagnosis not present

## 2018-09-04 DIAGNOSIS — N978 Female infertility of other origin: Secondary | ICD-10-CM | POA: Diagnosis not present

## 2018-09-06 DIAGNOSIS — N978 Female infertility of other origin: Secondary | ICD-10-CM | POA: Diagnosis not present

## 2018-09-08 DIAGNOSIS — N978 Female infertility of other origin: Secondary | ICD-10-CM | POA: Diagnosis not present

## 2018-09-10 DIAGNOSIS — N978 Female infertility of other origin: Secondary | ICD-10-CM | POA: Diagnosis not present

## 2018-09-12 DIAGNOSIS — Z3183 Encounter for assisted reproductive fertility procedure cycle: Secondary | ICD-10-CM | POA: Diagnosis not present

## 2018-09-13 DIAGNOSIS — N978 Female infertility of other origin: Secondary | ICD-10-CM | POA: Diagnosis not present

## 2018-09-18 DIAGNOSIS — N978 Female infertility of other origin: Secondary | ICD-10-CM | POA: Diagnosis not present

## 2018-09-18 MED FILL — metFORMIN HCL ER 750 MG TB2: 750 | 30 days supply | Qty: 60 | Fill #2 | Status: TO

## 2018-10-02 DIAGNOSIS — Z3202 Encounter for pregnancy test, result negative: Secondary | ICD-10-CM | POA: Diagnosis not present

## 2018-10-02 DIAGNOSIS — N978 Female infertility of other origin: Secondary | ICD-10-CM | POA: Diagnosis not present

## 2018-10-02 DIAGNOSIS — E288 Other ovarian dysfunction: Secondary | ICD-10-CM | POA: Diagnosis not present

## 2018-10-02 DIAGNOSIS — N856 Intrauterine synechiae: Secondary | ICD-10-CM | POA: Diagnosis not present

## 2018-10-02 DIAGNOSIS — Z8742 Personal history of other diseases of the female genital tract: Secondary | ICD-10-CM | POA: Diagnosis not present

## 2018-10-02 MED FILL — ESTRING 2 MG VAGINAL RING: 2 | 90 days supply | Qty: 1 | Fill #0

## 2018-10-02 MED FILL — DOXYCYCLINE HYCLATE 100 MG: 100 | 4 days supply | Qty: 8 | Fill #0

## 2018-10-21 ENCOUNTER — Other Ambulatory Visit: Payer: Self-pay | Admitting: Nurse Practitioner

## 2018-10-22 DIAGNOSIS — N978 Female infertility of other origin: Secondary | ICD-10-CM | POA: Diagnosis not present

## 2018-10-22 DIAGNOSIS — Z3183 Encounter for assisted reproductive fertility procedure cycle: Secondary | ICD-10-CM | POA: Diagnosis not present

## 2018-10-28 DIAGNOSIS — Z3183 Encounter for assisted reproductive fertility procedure cycle: Secondary | ICD-10-CM | POA: Diagnosis not present

## 2018-10-28 DIAGNOSIS — N978 Female infertility of other origin: Secondary | ICD-10-CM | POA: Diagnosis not present

## 2018-11-03 DIAGNOSIS — Z3183 Encounter for assisted reproductive fertility procedure cycle: Secondary | ICD-10-CM | POA: Diagnosis not present

## 2018-11-03 DIAGNOSIS — N978 Female infertility of other origin: Secondary | ICD-10-CM | POA: Diagnosis not present

## 2018-11-08 DIAGNOSIS — N978 Female infertility of other origin: Secondary | ICD-10-CM | POA: Diagnosis not present

## 2018-11-15 DIAGNOSIS — Z3200 Encounter for pregnancy test, result unknown: Secondary | ICD-10-CM | POA: Diagnosis not present

## 2018-11-22 DIAGNOSIS — Z3200 Encounter for pregnancy test, result unknown: Secondary | ICD-10-CM | POA: Diagnosis not present

## 2018-11-25 DIAGNOSIS — Z3200 Encounter for pregnancy test, result unknown: Secondary | ICD-10-CM | POA: Diagnosis not present

## 2018-12-06 ENCOUNTER — Other Ambulatory Visit: Payer: Self-pay | Admitting: Nurse Practitioner

## 2018-12-08 ENCOUNTER — Emergency Department
Admission: EM | Admit: 2018-12-08 | Discharge: 2018-12-08 | Disposition: A | Discharge status: Home / Self Care | Payer: 59 | Attending: Emergency Medicine | Admitting: Emergency Medicine

## 2018-12-08 ENCOUNTER — Encounter: Payer: Self-pay | Admitting: *Deleted

## 2018-12-08 ENCOUNTER — Emergency Department: Payer: 59

## 2018-12-08 ENCOUNTER — Other Ambulatory Visit: Payer: Self-pay

## 2018-12-08 ENCOUNTER — Encounter: Payer: Self-pay | Admitting: Nurse Practitioner

## 2018-12-08 DIAGNOSIS — R51 Headache: Secondary | ICD-10-CM | POA: Insufficient documentation

## 2018-12-08 DIAGNOSIS — Z79899 Other long term (current) drug therapy: Secondary | ICD-10-CM | POA: Insufficient documentation

## 2018-12-08 DIAGNOSIS — R519 Headache, unspecified: Secondary | ICD-10-CM

## 2018-12-08 MED ORDER — SODIUM CHLORIDE 0.9 % IV BOLUS
1000.0000 mL | Freq: Once | INTRAVENOUS | Status: AC
  Administered 2018-12-08: 21:00:00 1000 mL via INTRAVENOUS

## 2018-12-08 MED ORDER — PROCHLORPERAZINE EDISYLATE 10 MG/2ML IJ SOLN
10.0000 mg | Freq: Once | INTRAMUSCULAR | Status: AC
  Administered 2018-12-08: 10 mg via INTRAVENOUS
  Filled 2018-12-08: qty 2

## 2018-12-08 NOTE — Discharge Instructions (Addendum)
Please seek medical attention for any high fevers, chest pain, shortness of breath, change in behavior, persistent vomiting, bloody stool or any other new or concerning symptoms.  

## 2018-12-08 NOTE — ED Triage Notes (Signed)
Patient c/o headache for a few hours and became severe within the last hour. Patient c/o generalized weakness and had slurred speech earlier. Patient does not have slurred speech at this time. No weakness or difficulty with motor skills noted at this time. Patient c/o photosensitivity.

## 2018-12-08 NOTE — ED Provider Notes (Signed)
[facility] Emergency Department Provider Note  ____________________________________________   I have reviewed the triage vital signs and the nursing notes.   HISTORY  Chief Complaint Headache   History limited by: Not Limited   HPI Emily Blake is a 43 y.o. female who presents to the emergency department today because of concern for headache. The patient states that she started developing a headache this afternoon. Located in the front of her head. The patient states that when it first became severe she did have some difficulty with concentration and getting her words out. She does have history of anxiety so took a benzo without any significant relief of her headache. At the time of my exam the patient's only remaining symptom is the headache. Has never had a headache this severe. It is worse with bright lights.     Records reviewed. Per medical record review patient has a history of anxiety.   Past Medical History:  Diagnosis Date  . Anxiety   . Hydrosalpinx     Patient Active Problem List   Diagnosis Date Noted  . Fatigue 09/01/2018  . Generalized anxiety disorder with panic attacks 09/01/2018  . Anxiety 01/22/2018  . Posterior tibial tendon dysfunction (PTTD) of both lower extremities 09/06/2016  . Hydrosalpinx 08/23/2016    Past Surgical History:  Procedure Laterality Date  . tubiligation  12/2016    Prior to Admission medications   Medication Sig Start Date End Date Taking? Authorizing Provider  ALPRAZolam Duanne Moron) 0.5 MG tablet Take 1 tablet po BID prn anxiety. 08/26/18   Ronnell Freshwater, NP  estradiol (VIVELLE-DOT) 0.1 MG/24HR patch Place 1 patch onto the skin every 3 (three) days. 03/14/18   [provider]  metFORMIN (GLUCOPHAGE-XR) 750 MG 24 hr tablet Take 750 mg by mouth daily with breakfast. Take morning and night    [provider]  traMADol (ULTRAM) 50 MG tablet Take 1 tablet (50 mg total) by mouth every 6  (six) hours as needed. Patient not taking: Reported on 08/26/2018 03/26/18   Long, Wonda Olds, MD    Allergies Amoxicillin  Family History  Problem Relation Age of Onset  . Diabetes Mother   . Hypertension Mother   . Hypertension Father   . Hypothyroidism Father     Social History Social History   Tobacco Use  . Smoking status: Never Smoker  . Smokeless tobacco: Never Used  Substance Use Topics  . Alcohol use: Not Currently    Frequency: Never  . Drug use: Not Currently    Review of Systems Constitutional: No fever/chills Eyes: No visual changes. ENT: No sore throat. Cardiovascular: Denies chest pain. Respiratory: Denies shortness of breath. Gastrointestinal: No abdominal pain.  No nausea, no vomiting.  No diarrhea.   Genitourinary: Negative for dysuria. Musculoskeletal: Negative for back pain. Skin: Negative for rash. Neurological: Positive for headache. ____________________________________________   PHYSICAL EXAM:  VITAL SIGNS: ED Triage Vitals  Enc Vitals Group     BP 12/08/18 2027 137/83     Pulse Rate 12/08/18 2027 (!) 109     Resp 12/08/18 2027 14     Temp 12/08/18 2027 98.3 F (36.8 C)     Temp Source 12/08/18 2027 Oral     SpO2 12/08/18 2027 100 %     Weight 12/08/18 2031 165 lb (74.8 kg)     Height 12/08/18 2031 5\' 2"  (1.575 m)     Head Circumference --      Peak Flow --  Pain Score 12/08/18 2030 8   Constitutional: Alert and oriented.  Eyes: Conjunctivae are normal.  ENT      Head: Normocephalic and atraumatic.      Nose: No congestion/rhinnorhea.      Mouth/Throat: Mucous membranes are moist.      Neck: No stridor. Hematological/Lymphatic/Immunilogical: No cervical lymphadenopathy. Cardiovascular: Normal rate, regular rhythm.  No murmurs, rubs, or gallops.  Respiratory: Normal respiratory effort without tachypnea nor retractions. Breath sounds are clear and equal bilaterally. No wheezes/rales/rhonchi. Gastrointestinal: Soft and non  tender. No rebound. No guarding.  Genitourinary: Deferred Musculoskeletal: Normal range of motion in all extremities. No lower extremity edema. Neurologic:  Normal speech and language. Face symmetric. PERRL. EOMI. Tongue midline. Strength 5/5 in upper and lower extremities. Sensation intact. No gross focal neurologic deficits are appreciated.  Skin:  Skin is warm, dry and intact. No rash noted. Psychiatric: Mood and affect are normal. Speech and behavior are normal. Patient exhibits appropriate insight and judgment.  ____________________________________________    LABS (pertinent positives/negatives)  None  ____________________________________________   EKG  None  ____________________________________________    RADIOLOGY  CT head Normal  ____________________________________________   PROCEDURES  Procedures  ____________________________________________   INITIAL IMPRESSION / ASSESSMENT AND PLAN / ED COURSE  Pertinent labs & imaging results that were available during my care of the patient were reviewed by me and considered in my medical decision making (see chart for details).   Patient presented to the emergency department today because of concerns for severe headache.  On my exam patient without any neuro deficits.  Head CT was obtained given worst headache of the patient's life.  This did not show any acute bleed.  Patient did feel better after IV fluids and medication.  Did feel comfortable going home.  Discussed return precautions importance of following up with primary care.  ___________________________________________   FINAL CLINICAL IMPRESSION(S) / ED DIAGNOSES  Final diagnoses:  Nonintractable headache, unspecified chronicity pattern, unspecified headache type     Note: This dictation was prepared with Dragon dictation. Any transcriptional errors that result from this process are unintentional     Nance Pear, MD 12/08/18 2233

## 2018-12-08 NOTE — ED Notes (Addendum)
Patient has a history of anxiety and took 1/2 of aprazolam earlier for feeling "tremors".

## 2018-12-08 NOTE — ED Notes (Signed)
Room darkened for patient's comfort.

## 2018-12-09 ENCOUNTER — Ambulatory Visit: Payer: 59 | Admitting: Nurse Practitioner

## 2018-12-09 ENCOUNTER — Encounter: Payer: Self-pay | Admitting: Nurse Practitioner

## 2018-12-09 ENCOUNTER — Other Ambulatory Visit: Payer: Self-pay

## 2018-12-09 VITALS — BP 120/82 | HR 89 | Ht 62.0 in | Wt 166.0 lb

## 2018-12-09 DIAGNOSIS — R030 Elevated blood-pressure reading, without diagnosis of hypertension: Secondary | ICD-10-CM | POA: Diagnosis not present

## 2018-12-09 DIAGNOSIS — Z319 Encounter for procreative management, unspecified: Secondary | ICD-10-CM | POA: Diagnosis not present

## 2018-12-09 DIAGNOSIS — R5383 Other fatigue: Secondary | ICD-10-CM

## 2018-12-09 NOTE — Telephone Encounter (Signed)
Can we schedule her on a day, not too busy? Thanks.

## 2018-12-09 NOTE — Progress Notes (Signed)
[facility] [place], [place] [address]  Internal MEDICINE  Telephone Visit  Patient Name: Emily Blake  [id number]  [id number]  Date of Service: 12/28/2018  I connected with the patient at 4:24pm by webcam and verified the patients identity using two identifiers.   I discussed the limitations, risks, security and privacy concerns of performing an evaluation and management service by erbcam and the availability of in person appointments. I also discussed with the patient that there may be a patient responsible charge related to the service.  The patient expressed understanding and agrees to proceed.    Chief Complaint  Patient presents with  . Telephone Screen    VIDEO VISIT is not working we are switching to telephone visit, [phone]  . Telephone Assessment  . Medical Management of Chronic Issues    pt recently went to ER 140/109 pulse 102, pt done CT as well at the ER  . Hypertension    abnormal blood pressure requesting lab slip    The patient has been contacted via webcam for follow up visit due to concerns for spread of novel coronavirus. She was recently seen in ER due to severe headache which would not go away. Her blood pressure was very elevated when she first arrived, but gradually came down throughout her visit there. CT scan was done in ER and was negative for any acute abnormalities. She was not given any new medication. Her blood pressure is good today. She no longer has a headache. She is due to have routine, fasting blood work done.       Current Medication: Outpatient Encounter Medications as of 12/09/2018  Medication Sig  . ALPRAZolam (XANAX) 0.5 MG tablet Take 1 tablet po BID prn anxiety.  Marland Kitchen estradiol (VIVELLE-DOT) 0.1 MG/24HR patch Place 1 patch onto the skin every 3 (three) days.  . metFORMIN (GLUCOPHAGE-XR) 750 MG 24 hr tablet Take 750 mg by mouth daily with breakfast. Take morning and night  . traMADol (ULTRAM) 50 MG tablet  Take 1 tablet (50 mg total) by mouth every 6 (six) hours as needed. (Patient not taking: Reported on 08/26/2018)   No facility-administered encounter medications on file as of 12/09/2018.     Surgical History: Past Surgical History:  Procedure Laterality Date  . tubiligation  12/2016    Medical History: Past Medical History:  Diagnosis Date  . Anxiety   . Hydrosalpinx     Family History: Family History  Problem Relation Age of Onset  . Diabetes Mother   . Hypertension Mother   . Hypertension Father   . Hypothyroidism Father     Social History   Socioeconomic History  . Marital status: Single    Spouse name: Not on file  . Number of children: Not on file  . Years of education: Not on file  . Highest education level: Not on file  Occupational History  . Not on file  Social Needs  . Financial resource strain: Not on file  . Food insecurity:    Worry: Not on file    Inability: Not on file  . Transportation needs:    Medical: Not on file    Non-medical: Not on file  Tobacco Use  . Smoking status: Never Smoker  . Smokeless tobacco: Never Used  Substance and Sexual Activity  . Alcohol use: Not Currently    Frequency: Never  . Drug use: Not Currently  . Sexual activity: Not on file  Lifestyle  . Physical activity:  Days per week: Not on file    Minutes per session: Not on file  . Stress: Not on file  Relationships  . Social connections:    Talks on phone: Not on file    Gets together: Not on file    Attends religious service: Not on file    Active member of club or organization: Not on file    Attends meetings of clubs or organizations: Not on file    Relationship status: Not on file  . Intimate partner violence:    Fear of current or ex partner: Not on file    Emotionally abused: Not on file    Physically abused: Not on file    Forced sexual activity: Not on file  Other Topics Concern  . Not on file  Social History Narrative  . Not on file       Review of Systems  Constitutional: Positive for fatigue. Negative for chills and unexpected weight change.  HENT: Negative for congestion, postnasal drip, rhinorrhea, sneezing and sore throat.   Respiratory: Negative for cough, chest tightness, shortness of breath and wheezing.   Cardiovascular: Negative for chest pain and palpitations.       Incident of elevated blood pressure which caused severe headache.   Gastrointestinal: Negative for abdominal pain, constipation, diarrhea, nausea and vomiting.  Endocrine: Negative for cold intolerance, heat intolerance, polydipsia and polyuria.  Musculoskeletal: Negative for arthralgias, back pain, joint swelling and neck pain.  Skin: Negative for rash.  Allergic/Immunologic: Negative for environmental allergies.  Neurological: Negative for dizziness, tremors, numbness and headaches.  Hematological: Negative for adenopathy. Does not bruise/bleed easily.  Psychiatric/Behavioral: Negative for behavioral problems (Depression), sleep disturbance and suicidal ideas. The patient is nervous/anxious.     Today's Vitals   12/09/18 1606  BP: 120/82  Pulse: 89  Weight: 166 lb (75.3 kg)  Height: 5\' 2"  (1.575 m)   Body mass index is 30.36 kg/m.  Observation/Objective:   The patient is alert and oriented. She is pleasant and answers all questions appropriately. Breathing is non-labored. She is in no acute distress at this time.    Assessment/Plan: 1. Elevated blood pressure reading Recently high blood pressure. Much improved today without medication. Will have her continue to monitor closely.   2. Fatigue, unspecified type Will check labs for further evaluation. Lab slip sent to patient.   3. Infertility management Check hormone levels for further evaluation.   General Counseling: Calaya verbalizes understanding of the findings of today's phone visit and agrees with plan of treatment. I have discussed any further diagnostic evaluation  that may be needed or ordered today. We also reviewed her medications today. she has been encouraged to call the office with any questions or concerns that should arise related to todays visit.  Hypertension Counseling:   The following hypertensive lifestyle modification were recommended and discussed:  1. Limiting alcohol intake to less than 1 oz/day of ethanol:(24 oz of beer or 8 oz of wine or 2 oz of 100-proof whiskey). 2. Take baby ASA 81 mg daily. 3. Importance of regular aerobic exercise and losing weight. 4. Reduce dietary saturated fat and cholesterol intake for overall cardiovascular health. 5. Maintaining adequate dietary potassium, calcium, and magnesium intake. 6. Regular monitoring of the blood pressure. 7. Reduce sodium intake to less than 100 mmol/day (less than 2.3 gm of sodium or less than 6 gm of sodium choride)   This patient was seen by [place] with Dr Lavera Guise as a  part of collaborative care agreement   Time spent: 29 Minutes    Dr Lavera Guise Internal medicine

## 2018-12-14 ENCOUNTER — Encounter: Payer: Self-pay | Admitting: Emergency Medicine

## 2018-12-14 ENCOUNTER — Other Ambulatory Visit: Payer: Self-pay

## 2018-12-14 ENCOUNTER — Ambulatory Visit
Admission: EM | Admit: 2018-12-14 | Discharge: 2018-12-14 | Disposition: A | Discharge status: Nursing Home (Includes ICF) | Payer: 59 | Attending: Family Medicine | Admitting: Family Medicine

## 2018-12-14 DIAGNOSIS — R609 Edema, unspecified: Secondary | ICD-10-CM | POA: Diagnosis not present

## 2018-12-14 DIAGNOSIS — Z880 Allergy status to penicillin: Secondary | ICD-10-CM | POA: Diagnosis not present

## 2018-12-14 DIAGNOSIS — Z79899 Other long term (current) drug therapy: Secondary | ICD-10-CM | POA: Diagnosis not present

## 2018-12-14 DIAGNOSIS — R112 Nausea with vomiting, unspecified: Secondary | ICD-10-CM

## 2018-12-14 DIAGNOSIS — R1031 Right lower quadrant pain: Secondary | ICD-10-CM | POA: Diagnosis not present

## 2018-12-14 DIAGNOSIS — N7011 Chronic salpingitis: Secondary | ICD-10-CM | POA: Diagnosis not present

## 2018-12-14 DIAGNOSIS — N7091 Salpingitis, unspecified: Secondary | ICD-10-CM | POA: Diagnosis not present

## 2018-12-14 DIAGNOSIS — N739 Female pelvic inflammatory disease, unspecified: Secondary | ICD-10-CM | POA: Diagnosis not present

## 2018-12-14 DIAGNOSIS — K37 Unspecified appendicitis: Secondary | ICD-10-CM | POA: Diagnosis not present

## 2018-12-14 LAB — URINALYSIS, COMPLETE (UACMP) WITH MICROSCOPIC
Bilirubin Urine: NEGATIVE
Glucose, UA: NEGATIVE mg/dL
Leukocytes,Ua: NEGATIVE
Nitrite: NEGATIVE
Protein, ur: NEGATIVE mg/dL
Specific Gravity, Urine: 1.02 (ref 1.005–1.030)
WBC, UA: NONE SEEN WBC/hpf (ref 0–5)
pH: 7 (ref 5.0–8.0)

## 2018-12-14 MED ORDER — ONDANSETRON 8 MG PO TBDP
8.0000 mg | ORAL_TABLET | Freq: Once | ORAL | Status: AC
  Administered 2018-12-14: 8 mg via ORAL

## 2018-12-14 NOTE — ED Triage Notes (Signed)
Patient c/o RLQ abdominal pain and vomiting that started this morning. Patient states the pain is getting worse.

## 2018-12-14 NOTE — Discharge Instructions (Addendum)
Go directly to the emergency room as discussed.  °

## 2018-12-14 NOTE — ED Provider Notes (Addendum)
[facility] ____________________________________________  Time seen: Approximately 1:32 PM  I have reviewed the triage vital signs and the nursing notes.   HISTORY  Chief Complaint Abdominal Pain and Emesis   HPI Emily Blake is a 43 y.o. female presenting for evaluation of acute onset of right lower abdominal pain that started around 8 this morning.  Reports she has had multiple episodes of vomiting accompanying this.  Denies diarrhea or constipation.  No recent fevers.   States she has had an history of hydrosalpinx bilateral away with tubal ligation, but denies current pain being similar.  States took Tylenol earlier today without resolution.  Continues to have nausea and vomiting with accompanying pain.  Occasionally feels pain in her back but otherwise states focal to right lower abdomen.  Denies other recent complaints. Denies pain radiation or injury.     Past Medical History:  Diagnosis Date  . Anxiety   . Hydrosalpinx     Patient Active Problem List   Diagnosis Date Noted  . Fatigue 09/01/2018  . Generalized anxiety disorder with panic attacks 09/01/2018  . Anxiety 01/22/2018  . Posterior tibial tendon dysfunction (PTTD) of both lower extremities 09/06/2016  . Hydrosalpinx 08/23/2016    Past Surgical History:  Procedure Laterality Date  . tubiligation  12/2016     No current facility-administered medications for this encounter.   Current Outpatient Medications:  .  ALPRAZolam (XANAX) 0.5 MG tablet, Take 1 tablet po BID prn anxiety., Disp: 60 tablet, Rfl: 3 .  metFORMIN (GLUCOPHAGE-XR) 750 MG 24 hr tablet, Take 750 mg by mouth daily with breakfast. Take morning and night, Disp: , Rfl:  .  estradiol (VIVELLE-DOT) 0.1 MG/24HR patch, Place 1 patch onto the skin every 3 (three) days., Disp: , Rfl: 3 .  traMADol (ULTRAM) 50 MG tablet, Take 1 tablet (50 mg total) by mouth every 6 (six) hours as needed. (Patient not taking: Reported on 08/26/2018),  Disp: 12 tablet, Rfl: 0  Allergies Amoxicillin  Family History  Problem Relation Age of Onset  . Diabetes Mother   . Hypertension Mother   . Hypertension Father   . Hypothyroidism Father     Social History Social History   Tobacco Use  . Smoking status: Never Smoker  . Smokeless tobacco: Never Used  Substance Use Topics  . Alcohol use: Not Currently    Frequency: Never  . Drug use: Not Currently    Review of Systems Constitutional: No fever Cardiovascular: Denies chest pain. Respiratory: Denies shortness of breath. Gastrointestinal: as above.  Genitourinary: Negative for dysuria. Musculoskeletal: as above.  Skin: Negative for rash.   ____________________________________________   PHYSICAL EXAM:  VITAL SIGNS: ED Triage Vitals [12/14/18 1239]  Enc Vitals Group     BP (!) 144/95     Pulse Rate 78     Resp 18     Temp 97.9 F (36.6 C)     Temp Source Oral     SpO2 100 %     Weight 166 lb (75.3 kg)     Height 5\' 2"  (1.575 m)     Head Circumference      Peak Flow      Pain Score 10     Pain Loc      Pain Edu?      Excl. in [place]?     Constitutional: Alert and oriented. Well appearing and in no acute distress. ENT      Head: Normocephalic and atraumatic. Cardiovascular: Normal rate, regular rhythm. Grossly  normal heart sounds.  Good peripheral circulation. Respiratory: Normal respiratory effort without tachypnea nor retractions. Breath sounds are clear and equal bilaterally. No wheezes, rales, rhonchi. Gastrointestinal: Moderate right lower quadrant abdominal tenderness at McBurney's point.  Abdomen otherwise soft and nontender.  Non-guarding.  Normal bowel sounds.  No CVA tenderness Musculoskeletal:  No midline cervical, thoracic or lumbar tenderness to palpation.  Neurologic:  Normal speech and language.  Speech is normal. No gait instability.  Skin:  Skin is warm, dry and intact. No rash noted. Psychiatric: Mood and affect are normal. Speech and behavior  are normal. Patient exhibits appropriate insight and judgment   ___________________________________________   LABS (all labs ordered are listed, but only abnormal results are displayed)  Labs Reviewed  URINALYSIS, COMPLETE (UACMP) WITH MICROSCOPIC - Abnormal; Notable for the following components:      Result Value   Hgb urine dipstick TRACE (*)    Ketones, ur TRACE (*)    Bacteria, UA RARE (*)    All other components within normal limits   ____________________________________________  PROCEDURES Procedures    INITIAL IMPRESSION / ASSESSMENT AND PLAN / ED COURSE  Pertinent labs & imaging results that were available during my care of the patient were reviewed by me and considered in my medical decision making (see chart for details).  Patient presenting with acute onset of right lower quadrant abdominal pain with accompanying nausea, vomiting.  Concern for acute pelvic pain, PID, ovarian torsion, hydrosalpinx, vs acute appendicitis.  Recommend further evaluation in emergency room at this time.  8 mg ODT Zofran given once.  Patient states that her fianc will drive her to [facility]. Patient agrees to plan.  ____________________________________________   FINAL CLINICAL IMPRESSION(S) / ED DIAGNOSES  Final diagnoses:  Right lower quadrant abdominal pain     ED Discharge Orders    None       Note: This dictation was prepared with Dragon dictation along with smaller phrase technology. Any transcriptional errors that result from this process are unintentional.        Marylene Land, NP 12/14/18 1659

## 2018-12-16 ENCOUNTER — Telehealth: Payer: Self-pay

## 2018-12-17 NOTE — Telephone Encounter (Signed)
[facility] LABSSLIP READY FOR PICKUP

## 2018-12-17 NOTE — Telephone Encounter (Signed)
Lab slip on your desk. She can pick up anytime.

## 2018-12-18 ENCOUNTER — Other Ambulatory Visit: Payer: Self-pay | Admitting: Nurse Practitioner

## 2018-12-18 DIAGNOSIS — Z319 Encounter for procreative management, unspecified: Secondary | ICD-10-CM | POA: Diagnosis not present

## 2018-12-18 DIAGNOSIS — R7301 Impaired fasting glucose: Secondary | ICD-10-CM | POA: Diagnosis not present

## 2018-12-18 DIAGNOSIS — R5383 Other fatigue: Secondary | ICD-10-CM | POA: Diagnosis not present

## 2018-12-18 DIAGNOSIS — I10 Essential (primary) hypertension: Secondary | ICD-10-CM | POA: Diagnosis not present

## 2018-12-18 DIAGNOSIS — Z0001 Encounter for general adult medical examination with abnormal findings: Secondary | ICD-10-CM | POA: Diagnosis not present

## 2018-12-18 DIAGNOSIS — E559 Vitamin D deficiency, unspecified: Secondary | ICD-10-CM | POA: Diagnosis not present

## 2018-12-19 LAB — CBC
Hematocrit: 37.7 % (ref 34.0–46.6)
Hemoglobin: 12.9 g/dL (ref 11.1–15.9)
MCH: 31.5 pg (ref 26.6–33.0)
MCHC: 34.2 g/dL (ref 31.5–35.7)
MCV: 92 fL (ref 79–97)
Platelets: 272 10*3/uL (ref 150–450)
RBC: 4.09 x10E6/uL (ref 3.77–5.28)
RDW: 11.9 % (ref 11.7–15.4)
WBC: 9.2 10*3/uL (ref 3.4–10.8)

## 2018-12-19 LAB — FSH/LH
FSH: 4.8 m[IU]/mL
LH: 3 m[IU]/mL

## 2018-12-19 LAB — HGB A1C W/O EAG: Hgb A1c MFr Bld: 4.8 % (ref 4.8–5.6)

## 2018-12-19 LAB — LIPID PANEL W/O CHOL/HDL RATIO
Cholesterol, Total: 179 mg/dL (ref 100–199)
HDL: 61 mg/dL (ref >39)
LDL Calculated: 82 mg/dL (ref 0–99)
Triglycerides: 181 mg/dL — ABNORMAL HIGH (ref 0–149)
VLDL Cholesterol Cal: 36 mg/dL (ref 5–40)

## 2018-12-19 LAB — COMPREHENSIVE METABOLIC PANEL
ALT: 10 IU/L (ref 0–32)
AST: 14 IU/L (ref 0–40)
Albumin/Globulin Ratio: 1.3 (ref 1.2–2.2)
Albumin: 3.5 g/dL — ABNORMAL LOW (ref 3.8–4.8)
Alkaline Phosphatase: 96 IU/L (ref 39–117)
BUN/Creatinine Ratio: 14 (ref 9–23)
BUN: 10 mg/dL (ref 6–24)
Bilirubin Total: 0.4 mg/dL (ref 0.0–1.2)
CO2: 22 mmol/L (ref 20–29)
Calcium: 9.1 mg/dL (ref 8.7–10.2)
Chloride: 102 mmol/L (ref 96–106)
Creatinine, Ser: 0.74 mg/dL (ref 0.57–1.00)
GFR calc Af Amer: 116 mL/min/{1.73_m2} (ref >59)
GFR calc non Af Amer: 101 mL/min/{1.73_m2} (ref >59)
Globulin, Total: 2.6 g/dL (ref 1.5–4.5)
Glucose: 80 mg/dL (ref 65–99)
Potassium: 4.3 mmol/L (ref 3.5–5.2)
Sodium: 138 mmol/L (ref 134–144)
Total Protein: 6.1 g/dL (ref 6.0–8.5)

## 2018-12-19 LAB — ESTRADIOL: Estradiol: 147.1 pg/mL

## 2018-12-19 LAB — T3: T3, Total: 118 ng/dL (ref 71–180)

## 2018-12-19 LAB — TSH: TSH: 2.44 u[IU]/mL (ref 0.450–4.500)

## 2018-12-19 LAB — T4, FREE: Free T4: 1.04 ng/dL (ref 0.82–1.77)

## 2018-12-19 LAB — PROLACTIN: Prolactin: 18.2 ng/mL (ref 4.8–23.3)

## 2018-12-19 LAB — VITAMIN D 25 HYDROXY (VIT D DEFICIENCY, FRACTURES): Vit D, 25-Hydroxy: 31.4 ng/mL (ref 30.0–100.0)

## 2018-12-28 DIAGNOSIS — R030 Elevated blood-pressure reading, without diagnosis of hypertension: Secondary | ICD-10-CM | POA: Insufficient documentation

## 2018-12-28 DIAGNOSIS — Z0001 Encounter for general adult medical examination with abnormal findings: Secondary | ICD-10-CM | POA: Insufficient documentation

## 2019-01-20 IMAGING — CT CT ABD-PELV W/ CM
2 of 4 series · 15 of 46 positions shown, 17 images · IV contrast (APPLIED)
Comparison: None.

CLINICAL DATA: Right lower quadrant pain with nausea vomiting.

EXAM:
CT ABDOMEN AND PELVIS WITH CONTRAST
TECHNIQUE: Multidetector CT imaging of the abdomen and pelvis was performed
using the standard protocol following bolus administration of
intravenous contrast.
CONTRAST:  100mL BX5MKH-8PP IOPAMIDOL (BX5MKH-8PP) INJECTION 61%

[Series 2: axial st · axial · 0.98mm/px · z∈[-513,-68]mm · 12 of 99 slices shown, 14 images]
[im 5/99  soft-tissue]
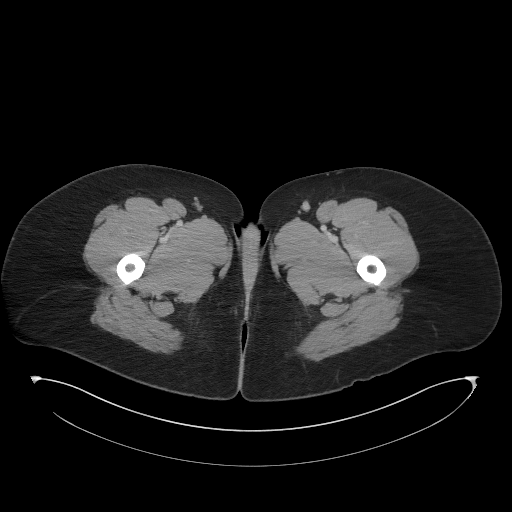
[im 5/99  bone]
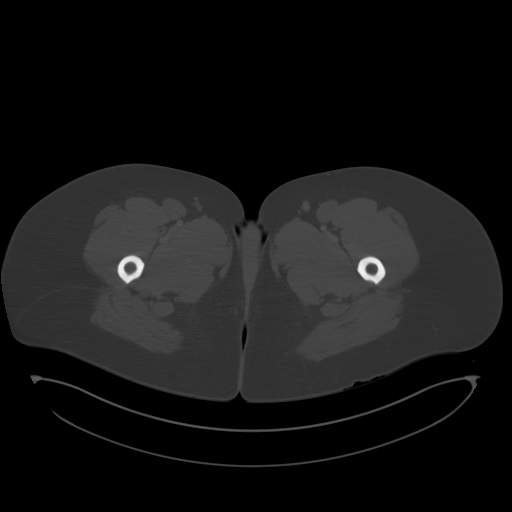
[im 13/99  soft-tissue]
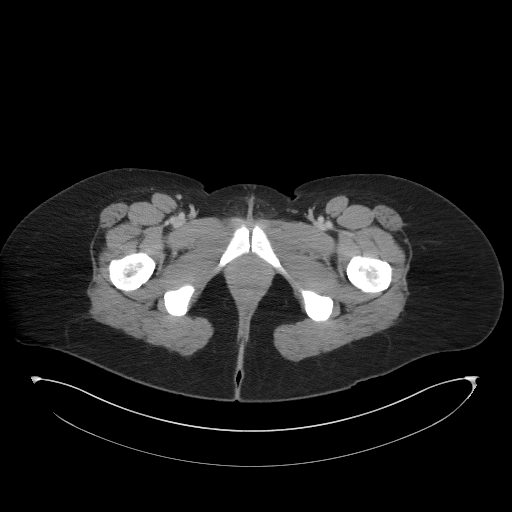
[im 21/99  soft-tissue]
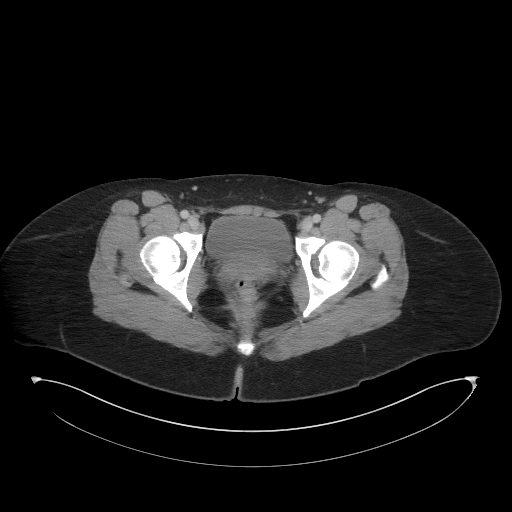
[im 29/99  soft-tissue]
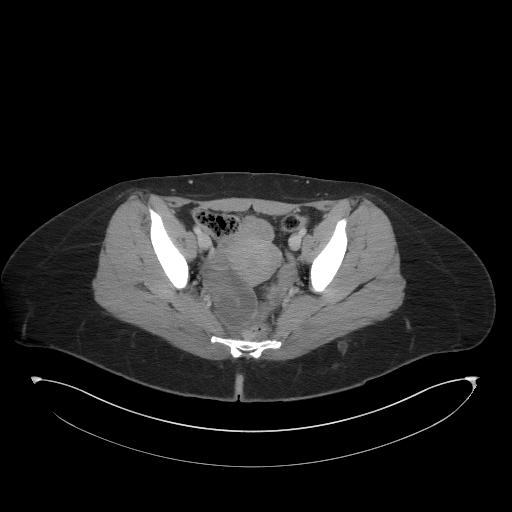
[im 37/99  soft-tissue]
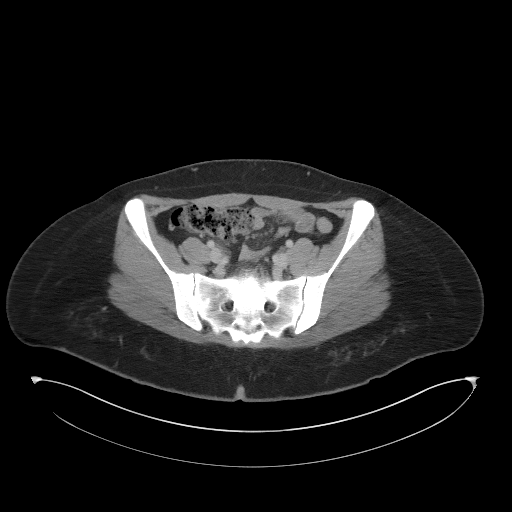
[im 45/99  soft-tissue]
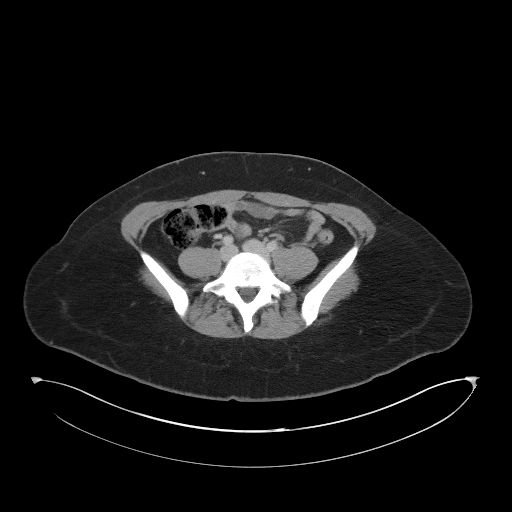
[im 54/99  soft-tissue]
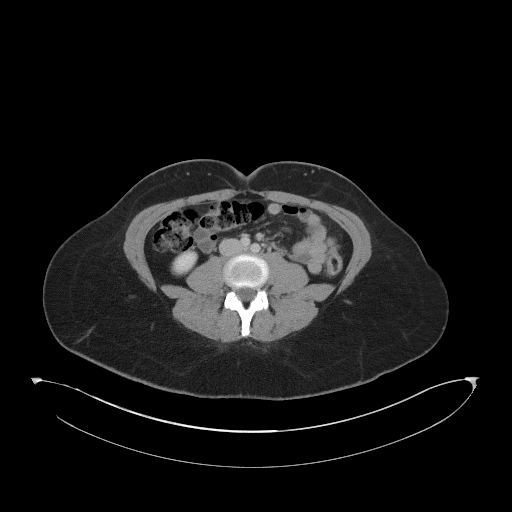
[im 62/99  soft-tissue]
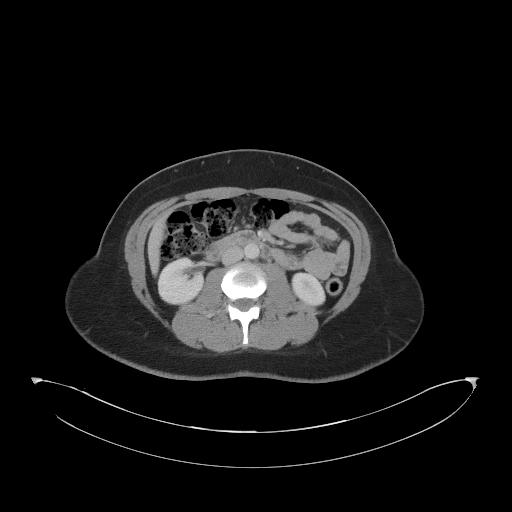
[im 70/99  soft-tissue]
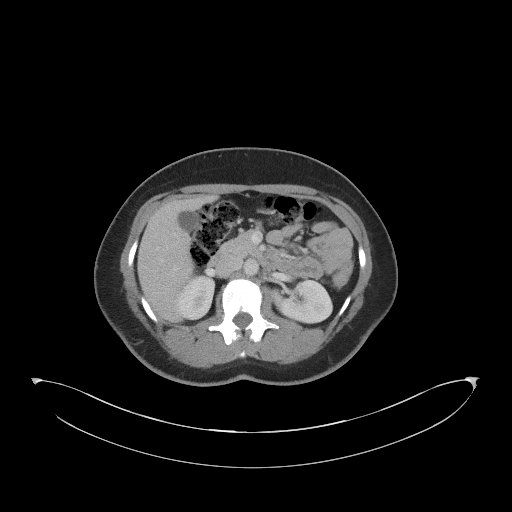
[im 70/99  bone]
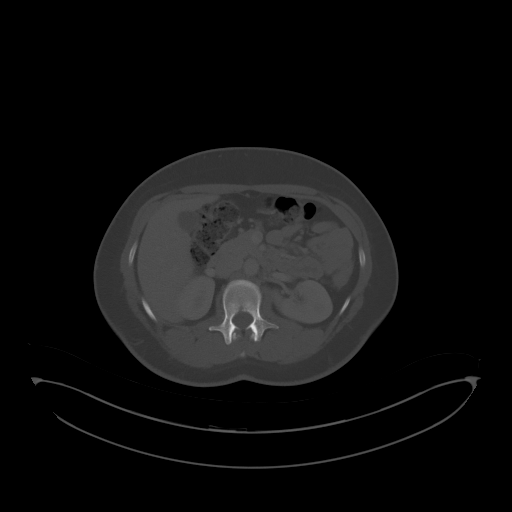
[im 78/99  soft-tissue]
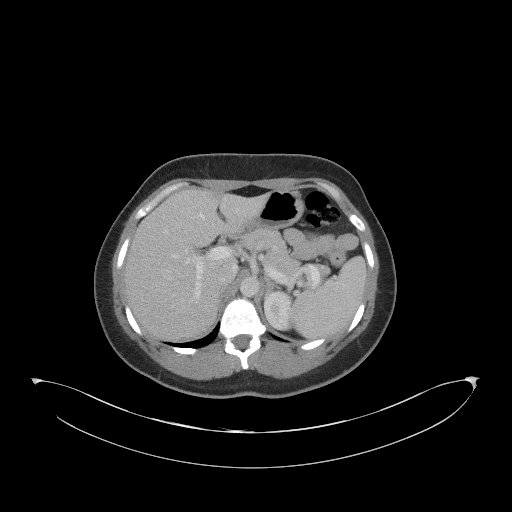
[im 86/99  soft-tissue]
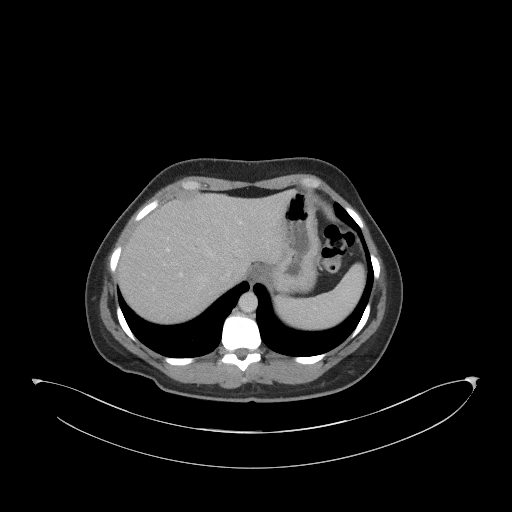
[im 94/99  soft-tissue]
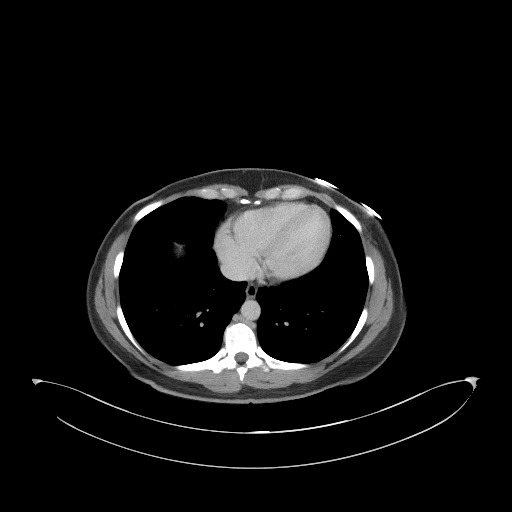

[Series 5: coronal st · coronal · 0.85mm/px · 3 of 87 slices shown]
[im 29/87  soft-tissue]
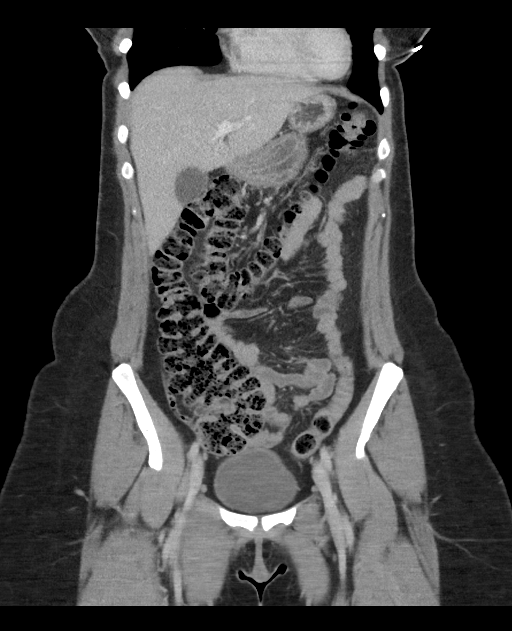
[im 39/87  soft-tissue]
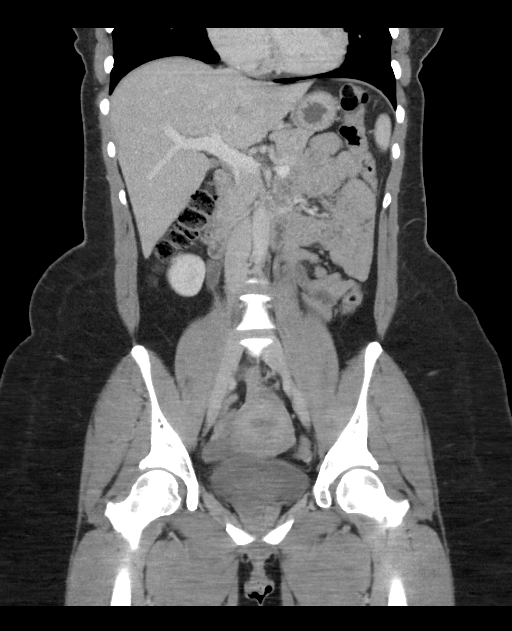
[im 48/87  soft-tissue]
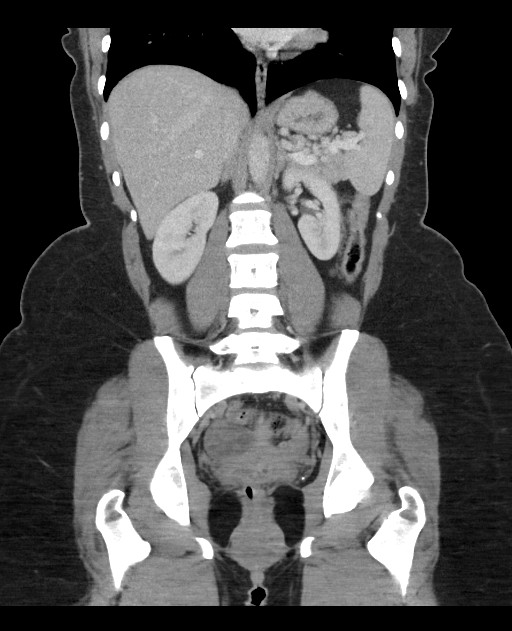

[15 of 46 positions shown; findings below may reference images not displayed]

FINDINGS: Lower chest: Unremarkable.

Hepatobiliary: Small area of low attenuation in the anterior liver,
adjacent to the falciform ligament, is in a characteristic location
for focal fatty deposition. There is no evidence for gallstones,
gallbladder wall thickening, or pericholecystic fluid. No
intrahepatic or extrahepatic biliary dilation.

Pancreas: No focal mass lesion. No dilatation of the main duct. No
intraparenchymal cyst. No peripancreatic edema.

Spleen: No splenomegaly. No focal mass lesion.

Adrenals/Urinary Tract: No adrenal nodule or mass. Kidneys
unremarkable. No evidence for hydroureter. The urinary bladder
appears normal for the degree of distention.

Stomach/Bowel: Stomach is nondistended. No gastric wall thickening.
No evidence of outlet obstruction. Duodenum is normally positioned
as is the ligament of Treitz. No small bowel wall thickening. No
small bowel dilatation. The terminal ileum is normal. Appendiceal
diameter is upper normal at 6 mm. Appendiceal lumen is largely
air-filled. There is some trace fluid around the appendix. No gross
colonic mass. No colonic wall thickening. No substantial
diverticular change.

Vascular/Lymphatic: No abdominal aortic aneurysm. There is no
gastrohepatic or hepatoduodenal ligament lymphadenopathy. No
intraperitoneal or retroperitoneal lymphadenopathy. No pelvic
sidewall lymphadenopathy.

Reproductive: Uterus unremarkable. Fluid-filled tubular structures
are identified in the posterior adnexal spaces bilaterally,
measuring up to 2.0 cm diameter on the right.. No gross adnexal
mass.

Other: Small volume free fluid noted in the cul-de-sac.

Musculoskeletal: No worrisome lytic or sclerotic osseous
abnormality.
IMPRESSION: 1. Fluid-filled tubular structures in the posterior adnexal spaces
bilaterally are most likely dilated fallopian tubes. Imaging
features compatible with bilateral hydrosalpinx and superinfection
(pyosalpinx) could have this appearance.
2. Small volume intraperitoneal free fluid in the cul-de-sac. There
is trace fluid layering along the appendix, but the appendix is
otherwise normal in appearance.

## 2019-01-30 ENCOUNTER — Ambulatory Visit: Payer: 59 | Admitting: Nurse Practitioner

## 2019-01-30 ENCOUNTER — Encounter: Payer: Self-pay | Admitting: Nurse Practitioner

## 2019-01-30 ENCOUNTER — Other Ambulatory Visit: Payer: Self-pay

## 2019-01-30 VITALS — BP 121/84 | HR 87 | Resp 16 | Ht 62.0 in | Wt 165.8 lb

## 2019-01-30 DIAGNOSIS — Z124 Encounter for screening for malignant neoplasm of cervix: Secondary | ICD-10-CM

## 2019-01-30 DIAGNOSIS — F411 Generalized anxiety disorder: Secondary | ICD-10-CM

## 2019-01-30 DIAGNOSIS — R300 Dysuria: Secondary | ICD-10-CM

## 2019-01-30 DIAGNOSIS — F410 Panic disorder [episodic paroxysmal anxiety] without agoraphobia: Secondary | ICD-10-CM

## 2019-01-30 DIAGNOSIS — Z0001 Encounter for general adult medical examination with abnormal findings: Secondary | ICD-10-CM | POA: Diagnosis not present

## 2019-01-30 MED ORDER — ALPRAZOLAM 0.5 MG PO TABS: ORAL_TABLET | ORAL | 3 refills | Status: DC

## 2019-01-30 NOTE — Progress Notes (Signed)
[facility] [place], [place] [address]  Internal MEDICINE  Office Visit Note  Patient Name: Emily Blake  [id number]  [id number]  Date of Service: 02/02/2019   Pt is here for routine health maintenance examination  Chief Complaint  Patient presents with  . Annual Exam  . Anxiety     The patient is here for health maintenance exam.  She recently was seen in ER due to right lower quadrant abdominal pain. Diagnosed with pelvic inflammatory disease. Tests were negative for any sexually transmitted infection. Moderate bacterial vaginitis was present. She was put on doxycycline for 14 days. She is feeling much better. She had been going through IVF treatments. Possible that hormones and loss of pregnancy contributed to this infection.  The patient did have routine fasting labs done since her last visit. Triglycerides were mildly elevated. Other labs looked good.     Current Medication: Outpatient Encounter Medications as of 01/30/2019  Medication Sig  . ALPRAZolam (XANAX) 0.5 MG tablet Take 1 tablet po BID prn anxiety.  Marland Kitchen estradiol (VIVELLE-DOT) 0.1 MG/24HR patch Place 1 patch onto the skin every 3 (three) days.  . metFORMIN (GLUCOPHAGE-XR) 750 MG 24 hr tablet Take 750 mg by mouth daily with breakfast. Take morning and night  . [DISCONTINUED] ALPRAZolam (XANAX) 0.5 MG tablet Take 1 tablet po BID prn anxiety.  . [DISCONTINUED] traMADol (ULTRAM) 50 MG tablet Take 1 tablet (50 mg total) by mouth every 6 (six) hours as needed. (Patient not taking: Reported on 08/26/2018)   No facility-administered encounter medications on file as of 01/30/2019.     Surgical History: Past Surgical History:  Procedure Laterality Date  . tubiligation  12/2016    Medical History: Past Medical History:  Diagnosis Date  . Anxiety   . Hydrosalpinx     Family History: Family History  Problem Relation Age of Onset  . Diabetes Mother   . Hypertension Mother   .  Hypertension Father   . Hypothyroidism Father       Review of Systems  Constitutional: Negative for chills, fatigue and unexpected weight change.  HENT: Negative for congestion, postnasal drip, rhinorrhea, sneezing and sore throat.   Respiratory: Negative for cough, chest tightness, shortness of breath and wheezing.   Cardiovascular: Negative for chest pain and palpitations.  Gastrointestinal: Negative for abdominal pain, constipation, diarrhea, nausea and vomiting.  Endocrine: Negative for cold intolerance, heat intolerance, polydipsia and polyuria.  Musculoskeletal: Negative for arthralgias, back pain, joint swelling and neck pain.  Skin: Negative for rash.  Allergic/Immunologic: Negative for environmental allergies.  Neurological: Negative for dizziness, tremors, numbness and headaches.  Hematological: Negative for adenopathy. Does not bruise/bleed easily.  Psychiatric/Behavioral: Negative for behavioral problems (Depression), sleep disturbance and suicidal ideas. The patient is nervous/anxious.     Today's Vitals   01/30/19 1416  BP: 121/84  Pulse: 87  Resp: 16  SpO2: 98%  Weight: 165 lb 12.8 oz (75.2 kg)  Height: 5\' 2"  (1.575 m)   Body mass index is 30.33 kg/m.   Physical Exam Vitals signs and nursing note reviewed.  Constitutional:      General: She is not in acute distress.    Appearance: Normal appearance. She is well-developed. She is not diaphoretic.  HENT:     Head: Normocephalic and atraumatic.     Mouth/Throat:     Pharynx: No oropharyngeal exudate.  Eyes:     Pupils: Pupils are equal, round, and reactive to light.  Neck:  Musculoskeletal: Normal range of motion and neck supple. No muscular tenderness.     Thyroid: No thyromegaly.     Vascular: No JVD.     Trachea: No tracheal deviation.  Cardiovascular:     Rate and Rhythm: Normal rate and regular rhythm.     Pulses: Normal pulses.     Heart sounds: Normal heart sounds. No murmur. No friction rub.  No gallop.   Pulmonary:     Effort: Pulmonary effort is normal. No respiratory distress.     Breath sounds: Normal breath sounds. No wheezing or rales.  Chest:     Chest wall: No tenderness.     Breasts:        Right: Normal. No swelling, bleeding, inverted nipple, mass, nipple discharge, skin change or tenderness.        Left: Normal. No swelling, bleeding, inverted nipple, mass, nipple discharge, skin change or tenderness.  Abdominal:     General: Bowel sounds are normal.     Palpations: Abdomen is soft.     Tenderness: There is no abdominal tenderness.     Hernia: There is no hernia in the left inguinal area or right inguinal area.  Genitourinary:    General: Normal vulva.     Exam position: Supine.     Labia:        Right: No rash or tenderness.        Left: No rash or tenderness.      Vagina: Normal. No vaginal discharge, erythema, tenderness or bleeding.     Cervix: No cervical motion tenderness, discharge, erythema or cervical bleeding.     Uterus: Normal.      Adnexa: Right adnexa normal and left adnexa normal.     Comments: No tenderness, masses, or organomeglay present during bimanual exam . Musculoskeletal: Normal range of motion.  Lymphadenopathy:     Cervical: No cervical adenopathy.     Lower Body: No right inguinal adenopathy. No left inguinal adenopathy.  Skin:    General: Skin is warm and dry.  Neurological:     Mental Status: She is alert and oriented to person, place, and time.     Cranial Nerves: No cranial nerve deficit.  Psychiatric:        Attention and Perception: Attention and perception normal.        Mood and Affect: Mood is anxious. Affect is tearful.        Speech: Speech normal.        Behavior: Behavior normal. Behavior is cooperative.        Thought Content: Thought content normal.        Cognition and Memory: Cognition and memory normal.        Judgment: Judgment normal.      LABS: Recent Results (from the past 2160 hour(s))   Urinalysis, Complete w Microscopic     Status: Abnormal   Collection Time: 12/14/18 12:42 PM  Result Value Ref Range   Color, Urine YELLOW YELLOW   APPearance CLEAR CLEAR   Specific Gravity, Urine 1.020 1.005 - 1.030   pH 7.0 5.0 - 8.0   Glucose, UA NEGATIVE NEGATIVE mg/dL   Hgb urine dipstick TRACE (A) NEGATIVE   Bilirubin Urine NEGATIVE NEGATIVE   Ketones, ur TRACE (A) NEGATIVE mg/dL   Protein, ur NEGATIVE NEGATIVE mg/dL   Nitrite NEGATIVE NEGATIVE   Leukocytes,Ua NEGATIVE NEGATIVE   Squamous Epithelial / LPF 6-10 0 - 5   WBC, UA NONE SEEN 0 - 5 WBC/hpf  RBC / HPF 6-10 0 - 5 RBC/hpf   Bacteria, UA RARE (A) NONE SEEN   Mucus PRESENT     Comment: Performed at [facility], [address], [place], [place] [address]  Comprehensive metabolic panel     Status: Abnormal   Collection Time: 12/18/18  7:55 AM  Result Value Ref Range   Glucose 80 65 - 99 mg/dL   BUN 10 6 - 24 mg/dL   Creatinine, Ser 0.74 0.57 - 1.00 mg/dL   GFR calc non Af Amer 101 >59 mL/min/1.73   GFR calc Af Amer 116 >59 mL/min/1.73   BUN/Creatinine Ratio 14 9 - 23   Sodium 138 134 - 144 mmol/L   Potassium 4.3 3.5 - 5.2 mmol/L   Chloride 102 96 - 106 mmol/L   CO2 22 20 - 29 mmol/L   Calcium 9.1 8.7 - 10.2 mg/dL   Total Protein 6.1 6.0 - 8.5 g/dL   Albumin 3.5 (L) 3.8 - 4.8 g/dL   Globulin, Total 2.6 1.5 - 4.5 g/dL   Albumin/Globulin Ratio 1.3 1.2 - 2.2   Bilirubin Total 0.4 0.0 - 1.2 mg/dL   Alkaline Phosphatase 96 39 - 117 IU/L   AST 14 0 - 40 IU/L   ALT 10 0 - 32 IU/L  CBC     Status: None   Collection Time: 12/18/18  7:55 AM  Result Value Ref Range   WBC 9.2 3.4 - 10.8 x10E3/uL   RBC 4.09 3.77 - 5.28 x10E6/uL   Hemoglobin 12.9 11.1 - 15.9 g/dL   Hematocrit 37.7 34.0 - 46.6 %   MCV 92 79 - 97 fL   MCH 31.5 26.6 - 33.0 pg   MCHC 34.2 31.5 - 35.7 g/dL   RDW 11.9 11.7 - 15.4 %   Platelets 272 150 - 450 x10E3/uL  Lipid Panel w/o Chol/HDL Ratio     Status: Abnormal   Collection Time:  12/18/18  7:55 AM  Result Value Ref Range   Cholesterol, Total 179 100 - 199 mg/dL   Triglycerides 181 (H) 0 - 149 mg/dL   HDL 61 >39 mg/dL   VLDL Cholesterol Cal 36 5 - 40 mg/dL   LDL Calculated 82 0 - 99 mg/dL  FSH/LH     Status: None   Collection Time: 12/18/18  7:55 AM  Result Value Ref Range   LH 3.0 mIU/mL    Comment:                     Adult Female:                       Follicular phase      2.4 -  12.6                       Ovulation phase      14.0 -  95.6                       Luteal phase          1.0 -  11.4                       Postmenopausal        7.7 -  58.5    FSH 4.8 mIU/mL    Comment:  Adult Female:                       Follicular phase      3.5 -  12.5                       Ovulation phase       4.7 -  21.5                       Luteal phase          1.7 -   7.7                       Postmenopausal       25.8 - 134.8   Hgb A1c w/o eAG     Status: None   Collection Time: 12/18/18  7:55 AM  Result Value Ref Range   Hgb A1c MFr Bld 4.8 4.8 - 5.6 %    Comment:          Prediabetes: 5.7 - 6.4          Diabetes: >6.4          Glycemic control for adults with diabetes: <7.0   T4, free     Status: None   Collection Time: 12/18/18  7:55 AM  Result Value Ref Range   Free T4 1.04 0.82 - 1.77 ng/dL  TSH     Status: None   Collection Time: 12/18/18  7:55 AM  Result Value Ref Range   TSH 2.440 0.450 - 4.500 uIU/mL  Prolactin     Status: None   Collection Time: 12/18/18  7:55 AM  Result Value Ref Range   Prolactin 18.2 4.8 - 23.3 ng/mL  Estradiol     Status: None   Collection Time: 12/18/18  7:55 AM  Result Value Ref Range   Estradiol 147.1 pg/mL    Comment:                     Adult Female:                       Follicular phase   09.3 -   166.0                       Ovulation phase    85.8 -   498.0                       Luteal phase       43.8 -   211.0                       Postmenopausal     <6.0 -    54.7                      Pregnancy                       1st trimester     215.0 - >4300.0                     Girls (1-10 years)    6.0 -    27.0 Roche ECLIA methodology   VITAMIN D 25 Hydroxy (Vit-D Deficiency, Fractures)     Status: None   Collection Time: 12/18/18  7:55 AM  Result Value Ref Range   Vit D, 25-Hydroxy 31.4 30.0 - 100.0 ng/mL    Comment: Vitamin D deficiency has been defined by the [place] practice guideline as a level of serum 25-OH vitamin D less than 20 ng/mL (1,2). The Endocrine Society went on to further define vitamin D insufficiency as a level between 21 and 29 ng/mL (2). 1. IOM (Institute of Medicine). 2010. Dietary reference    intakes for calcium and D. [place]: The    [employer]. 2. Holick MF, Binkley [place], Bischoff-Ferrari HA, et al.    Evaluation, treatment, and prevention of vitamin D    deficiency: an Endocrine Society clinical practice    guideline. JCEM. 2011 Jul; 96(7):1911-30.   T3     Status: None   Collection Time: 12/18/18  7:55 AM  Result Value Ref Range   T3, Total 118 71 - 180 ng/dL  UA/M w/rflx Culture, Routine     Status: None   Collection Time: 01/30/19  2:17 PM   Specimen: Urine   URINE  Result Value Ref Range   Specific Gravity, UA 1.027 1.005 - 1.030   pH, UA 5.5 5.0 - 7.5   Color, UA Yellow Yellow   Appearance Ur Clear Clear   Leukocytes,UA Negative Negative   Protein,UA Negative Negative/Trace   Glucose, UA Negative Negative   Ketones, UA Negative Negative   RBC, UA Negative Negative   Bilirubin, UA Negative Negative   Urobilinogen, Ur 0.2 0.2 - 1.0 mg/dL   Nitrite, UA Negative Negative   Microscopic Examination Comment     Comment: Microscopic follows if indicated.   Microscopic Examination See below:     Comment: Microscopic was indicated and was performed.   Urinalysis Reflex Comment     Comment: This specimen will not reflex to a Urine Culture.  Microscopic Examination     Status:  Abnormal   Collection Time: 01/30/19  2:17 PM   URINE  Result Value Ref Range   WBC, UA 0-5 0 - 5 /hpf   RBC 0-2 0 - 2 /hpf   Epithelial Cells (non renal) 0-10 0 - 10 /hpf   Casts None seen None seen /lpf   Crystals Present (A) N/A   [place] Type Calcium Oxalate N/A   Mucus, UA Present Not Estab.   Bacteria, UA None seen None seen/Few     Assessment/Plan: 1. Encounter for general adult medical examination with abnormal findings Annual health maintenance exam with pap smear today.   2. Generalized anxiety disorder with panic attacks May take alprazolam 0.5mg  up to twice daily if needed for acute anxiety.  - ALPRAZolam (XANAX) 0.5 MG tablet; Take 1 tablet po BID prn anxiety.  Dispense: 60 tablet; Refill: 3  3. Screening for malignant neoplasm of cervix - Pap IG and HPV (high risk) DNA detection  4. Dysuria - UA/M w/rflx Culture, Routine  General Counseling: Rachele verbalizes understanding of the findings of todays visit and agrees with plan of treatment. I have discussed any further diagnostic evaluation that may be needed or ordered today. We also reviewed her medications today. she has been encouraged to call the office with any questions or concerns that should arise related to todays visit.    Counseling:  This patient was seen by Leretha Pol FNP Collaboration with Dr Lavera Guise as a part of collaborative care agreement  Orders Placed This Encounter  Procedures  . Microscopic Examination  . UA/M  w/rflx Culture, Routine    Meds ordered this encounter  Medications  . ALPRAZolam (XANAX) 0.5 MG tablet    Sig: Take 1 tablet po BID prn anxiety.    Dispense:  60 tablet    Refill:  3    Order Specific Question:   Supervising Provider    Answer:   Lavera Guise [[id number]]    Time spent: [place], MD  Internal Medicine

## 2019-01-31 LAB — UA/M W/RFLX CULTURE, ROUTINE
Bilirubin, UA: NEGATIVE
Glucose, UA: NEGATIVE
Ketones, UA: NEGATIVE
Leukocytes,UA: NEGATIVE
Nitrite, UA: NEGATIVE
Protein,UA: NEGATIVE
RBC, UA: NEGATIVE
Specific Gravity, UA: 1.027 (ref 1.005–1.030)
Urobilinogen, Ur: 0.2 mg/dL (ref 0.2–1.0)
pH, UA: 5.5 (ref 5.0–7.5)

## 2019-01-31 LAB — MICROSCOPIC EXAMINATION
Bacteria, UA: NONE SEEN
Casts: NONE SEEN /lpf

## 2019-02-02 DIAGNOSIS — Z124 Encounter for screening for malignant neoplasm of cervix: Secondary | ICD-10-CM | POA: Insufficient documentation

## 2019-02-02 DIAGNOSIS — R300 Dysuria: Secondary | ICD-10-CM | POA: Insufficient documentation

## 2019-02-11 LAB — PAP IG AND HPV HIGH-RISK: HPV, high-risk: NEGATIVE

## 2019-03-04 ENCOUNTER — Other Ambulatory Visit: Payer: Self-pay | Admitting: Nurse Practitioner

## 2019-03-04 DIAGNOSIS — Z1231 Encounter for screening mammogram for malignant neoplasm of breast: Secondary | ICD-10-CM

## 2019-03-25 ENCOUNTER — Encounter: Payer: Self-pay | Admitting: Nurse Practitioner

## 2019-03-25 ENCOUNTER — Other Ambulatory Visit: Payer: Self-pay

## 2019-03-25 ENCOUNTER — Ambulatory Visit
Admission: RE | Admit: 2019-03-25 | Discharge: 2019-03-25 | Disposition: A | Discharge status: Home / Self Care | Payer: 59 | Source: Ambulatory Visit | Attending: Nurse Practitioner | Admitting: Nurse Practitioner

## 2019-03-25 DIAGNOSIS — Z1231 Encounter for screening mammogram for malignant neoplasm of breast: Secondary | ICD-10-CM | POA: Diagnosis not present

## 2019-03-28 ENCOUNTER — Encounter: Payer: Self-pay | Admitting: Nurse Practitioner

## 2019-03-28 ENCOUNTER — Ambulatory Visit: Payer: 59 | Admitting: Nurse Practitioner

## 2019-03-28 ENCOUNTER — Other Ambulatory Visit: Payer: Self-pay

## 2019-03-28 VITALS — BP 108/69 | HR 85 | Temp 98.5°F | Resp 16 | Ht 62.0 in | Wt 166.6 lb

## 2019-03-28 DIAGNOSIS — F411 Generalized anxiety disorder: Secondary | ICD-10-CM | POA: Diagnosis not present

## 2019-03-28 DIAGNOSIS — F988 Other specified behavioral and emotional disorders with onset usually occurring in childhood and adolescence: Secondary | ICD-10-CM | POA: Insufficient documentation

## 2019-03-28 DIAGNOSIS — N611 Abscess of the breast and nipple: Secondary | ICD-10-CM | POA: Diagnosis not present

## 2019-03-28 DIAGNOSIS — F410 Panic disorder [episodic paroxysmal anxiety] without agoraphobia: Secondary | ICD-10-CM

## 2019-03-28 MED ORDER — SULFAMETHOXAZOLE-TRIMETHOPRIM 800-160 MG PO TABS: 1.0000 | ORAL_TABLET | Freq: Two times a day (BID) | ORAL | 0 refills | Status: DC

## 2019-03-28 MED ORDER — AMPHETAMINE-DEXTROAMPHETAMINE 10 MG PO TABS: 10.0000 mg | ORAL_TABLET | Freq: Two times a day (BID) | ORAL | 0 refills | Status: DC

## 2019-03-28 MED ORDER — MUPIROCIN 2 % EX OINT: 1.0000 "application " | TOPICAL_OINTMENT | Freq: Two times a day (BID) | CUTANEOUS | 1 refills | Status: DC

## 2019-03-28 NOTE — Progress Notes (Signed)
[facility] [place], [place] [address]  Internal MEDICINE  Office Visit Note  Patient Name: Emily Blake  [id number]  [id number]  Date of Service: 03/28/2019  Chief Complaint  Patient presents with  . Medication Management    pt would like to take adderall again   . Anxiety  . Mass    on the boob    The patient is here for follow up visit. She is wanting to restart on adderall. She is getting ready to go to [employer] for [employer] She is finding that she is forgetful and gets off track while not on adderall. Stopped it while pregnant. She has had miscarriage and has not restarted this yet. In the past, she was on a 10mg  tablet twice daily if needed.  She states that she has a sore near the nipple of the left breast. Has been getting pus out of this wound. Is red, very sore, and itchy.       Current Medication: Outpatient Encounter Medications as of 03/28/2019  Medication Sig  . ALPRAZolam (XANAX) 0.5 MG tablet Take 1 tablet po BID prn anxiety.  . metFORMIN (GLUCOPHAGE-XR) 750 MG 24 hr tablet Take 750 mg by mouth daily with breakfast. Take morning and night  . amphetamine-dextroamphetamine (ADDERALL) 10 MG tablet Take 1 tablet (10 mg total) by mouth 2 (two) times daily.  . mupirocin ointment (BACTROBAN) 2 % Place 1 application into the nose 2 (two) times daily.  Marland Kitchen sulfamethoxazole-trimethoprim (BACTRIM DS) 800-160 MG tablet Take 1 tablet by mouth 2 (two) times daily.  . [DISCONTINUED] amphetamine-dextroamphetamine (ADDERALL) 10 MG tablet Take 1 tablet (10 mg total) by mouth 2 (two) times daily.  . [DISCONTINUED] amphetamine-dextroamphetamine (ADDERALL) 10 MG tablet Take 1 tablet (10 mg total) by mouth 2 (two) times daily.  . [DISCONTINUED] estradiol (VIVELLE-DOT) 0.1 MG/24HR patch Place 1 patch onto the skin every 3 (three) days.   No facility-administered encounter medications on file as of 03/28/2019.     Surgical History: Past Surgical  History:  Procedure Laterality Date  . tubiligation  12/2016    Medical History: Past Medical History:  Diagnosis Date  . Anxiety   . Hydrosalpinx     Family History: Family History  Problem Relation Age of Onset  . Diabetes Mother   . Hypertension Mother   . Hypertension Father   . Hypothyroidism Father   . Breast cancer Neg Hx     Social History   Socioeconomic History  . Marital status: Single    Spouse name: Not on file  . Number of children: Not on file  . Years of education: Not on file  . Highest education level: Not on file  Occupational History  . Not on file  Social Needs  . Financial resource strain: Not on file  . Food insecurity    Worry: Not on file    Inability: Not on file  . Transportation needs    Medical: Not on file    Non-medical: Not on file  Tobacco Use  . Smoking status: Never Smoker  . Smokeless tobacco: Never Used  Substance and Sexual Activity  . Alcohol use: Not Currently    Frequency: Never  . Drug use: Not Currently  . Sexual activity: Not on file  Lifestyle  . Physical activity    Days per week: Not on file    Minutes per session: Not on file  . Stress: Not on file  Relationships  . Social connections  Talks on phone: Not on file    Gets together: Not on file    Attends religious service: Not on file    Active member of club or organization: Not on file    Attends meetings of clubs or organizations: Not on file    Relationship status: Not on file  . Intimate partner violence    Fear of current or ex partner: Not on file    Emotionally abused: Not on file    Physically abused: Not on file    Forced sexual activity: Not on file  Other Topics Concern  . Not on file  Social History Narrative  . Not on file      Review of Systems  Constitutional: Negative for chills, fatigue and unexpected weight change.  HENT: Negative for congestion, postnasal drip, rhinorrhea, sneezing and sore throat.   Respiratory: Negative  for cough, chest tightness, shortness of breath and wheezing.   Cardiovascular: Negative for chest pain and palpitations.  Gastrointestinal: Negative for abdominal pain, constipation, diarrhea, nausea and vomiting.  Endocrine: Negative for cold intolerance, heat intolerance, polydipsia and polyuria.  Musculoskeletal: Negative for arthralgias, back pain, joint swelling and neck pain.  Skin: Negative for rash.       Skin lesion adjacent to the nipple of her left nipple. Tender, red, and draining serosanguinous fluid.   Allergic/Immunologic: Negative for environmental allergies.  Neurological: Negative for dizziness, tremors, numbness and headaches.  Hematological: Negative for adenopathy. Does not bruise/bleed easily.  Psychiatric/Behavioral: Positive for decreased concentration. Negative for behavioral problems (Depression), sleep disturbance and suicidal ideas. The patient is nervous/anxious.     Today's Vitals   03/28/19 1016  BP: 108/69  Pulse: 85  Resp: 16  Temp: 98.5 F (36.9 C)  SpO2: 99%  Weight: 166 lb 9.6 oz (75.6 kg)  Height: 5\' 2"  (1.575 m)   Body mass index is 30.47 kg/m.  Physical Exam Vitals signs and nursing note reviewed.  Constitutional:      General: She is not in acute distress.    Appearance: Normal appearance. She is well-developed. She is not diaphoretic.  HENT:     Head: Normocephalic and atraumatic.     Mouth/Throat:     Pharynx: No oropharyngeal exudate.  Eyes:     Pupils: Pupils are equal, round, and reactive to light.  Neck:     Musculoskeletal: Normal range of motion and neck supple.     Thyroid: No thyromegaly.     Vascular: No JVD.     Trachea: No tracheal deviation.  Cardiovascular:     Rate and Rhythm: Normal rate and regular rhythm.     Heart sounds: Normal heart sounds. No murmur. No friction rub. No gallop.   Pulmonary:     Effort: Pulmonary effort is normal. No respiratory distress.     Breath sounds: No wheezing or rales.  Chest:      Chest wall: No tenderness.  Abdominal:     General: Bowel sounds are normal.     Palpations: Abdomen is soft.  Musculoskeletal: Normal range of motion.       Arms:  Lymphadenopathy:     Cervical: No cervical adenopathy.  Skin:    General: Skin is warm and dry.  Neurological:     Mental Status: She is alert and oriented to person, place, and time.     Cranial Nerves: No cranial nerve deficit.  Psychiatric:        Behavior: Behavior normal.  Thought Content: Thought content normal.        Judgment: Judgment normal.    Assessment/Plan: 1. Furuncle of female breast Start bactrim DS bid for 10 days. Apply bactroban ointment twice daily when needed. Warm compress to help with irritation and inflammation - sulfamethoxazole-trimethoprim (BACTRIM DS) 800-160 MG tablet; Take 1 tablet by mouth 2 (two) times daily.  Dispense: 20 tablet; Refill: 0 - mupirocin ointment (BACTROBAN) 2 %; Place 1 application into the nose 2 (two) times daily.  Dispense: 22 g; Refill: 1  2. Attention deficit disorder (ADD) without hyperactivity Start adderall 10mg  twice daily as needed for focus and concentration. Three 30 day prescriptions provided today. Dates are 03/28/2019, 04/26/2019, and 05/24/2019 - amphetamine-dextroamphetamine (ADDERALL) 10 MG tablet; Take 1 tablet (10 mg total) by mouth 2 (two) times daily.  Dispense: 60 tablet; Refill: 0  3. Generalized anxiety disorder with panic attacks May continue alprazolam as needed and as prescribed   General Counseling: Nalaya verbalizes understanding of the findings of todays visit and agrees with plan of treatment. I have discussed any further diagnostic evaluation that may be needed or ordered today. We also reviewed her medications today. she has been encouraged to call the office with any questions or concerns that should arise related to todays visit.   This patient was seen by [place] with Dr Lavera Guise as a part of  collaborative care agreement  Meds ordered this encounter  Medications  . sulfamethoxazole-trimethoprim (BACTRIM DS) 800-160 MG tablet    Sig: Take 1 tablet by mouth 2 (two) times daily.    Dispense:  20 tablet    Refill:  0    Order Specific Question:   Supervising Provider    Answer:   Lavera Guise [[id number]]  . mupirocin ointment (BACTROBAN) 2 %    Sig: Place 1 application into the nose 2 (two) times daily.    Dispense:  22 g    Refill:  1    Order Specific Question:   Supervising Provider    Answer:   Lavera Guise [[id number]]  . DISCONTD: amphetamine-dextroamphetamine (ADDERALL) 10 MG tablet    Sig: Take 1 tablet (10 mg total) by mouth 2 (two) times daily.    Dispense:  60 tablet    Refill:  0    Order Specific Question:   Supervising Provider    Answer:   Lavera Guise [[id number]]  . DISCONTD: amphetamine-dextroamphetamine (ADDERALL) 10 MG tablet    Sig: Take 1 tablet (10 mg total) by mouth 2 (two) times daily.    Dispense:  60 tablet    Refill:  0    Please fill after 04/26/2019    Order Specific Question:   Supervising Provider    Answer:   Lavera Guise [[id number]]  . amphetamine-dextroamphetamine (ADDERALL) 10 MG tablet    Sig: Take 1 tablet (10 mg total) by mouth 2 (two) times daily.    Dispense:  60 tablet    Refill:  0    Please fill after 05/24/2019    Order Specific Question:   Supervising Provider    Answer:   Lavera Guise [[id number]]    Time spent: 25 Minutes      Dr Lavera Guise Internal medicine

## 2019-04-01 NOTE — Progress Notes (Signed)
Negative mammogram

## 2019-06-26 ENCOUNTER — Telehealth: Payer: Self-pay

## 2019-06-26 NOTE — Telephone Encounter (Signed)
LEFT MESSAGE FOR PATIENT TO CONFIRM AND GO OVER SCREENING QUESTIONS FOR 11-16 APPOINTMENT.

## 2019-06-30 ENCOUNTER — Ambulatory Visit: Payer: 59 | Admitting: Nurse Practitioner

## 2019-07-22 ENCOUNTER — Telehealth: Payer: Self-pay

## 2019-07-22 NOTE — Telephone Encounter (Signed)
Confirmed appointment with patient. klh °

## 2019-07-24 ENCOUNTER — Ambulatory Visit: Payer: Self-pay | Admitting: Nurse Practitioner

## 2019-07-24 ENCOUNTER — Ambulatory Visit: Payer: 59 | Admitting: Adult Health

## 2019-08-05 ENCOUNTER — Telehealth: Payer: Self-pay

## 2019-08-05 NOTE — Telephone Encounter (Signed)
Confirmed and screened for 08-07-19 ov.

## 2019-08-07 ENCOUNTER — Other Ambulatory Visit: Payer: Self-pay

## 2019-08-07 ENCOUNTER — Ambulatory Visit: Payer: 59 | Admitting: Adult Health

## 2019-08-07 ENCOUNTER — Encounter: Payer: Self-pay | Admitting: Adult Health

## 2019-08-07 VITALS — BP 118/72 | HR 80 | Ht 62.0 in | Wt 162.0 lb

## 2019-08-07 DIAGNOSIS — F988 Other specified behavioral and emotional disorders with onset usually occurring in childhood and adolescence: Secondary | ICD-10-CM

## 2019-08-07 DIAGNOSIS — F410 Panic disorder [episodic paroxysmal anxiety] without agoraphobia: Secondary | ICD-10-CM

## 2019-08-07 DIAGNOSIS — F411 Generalized anxiety disorder: Secondary | ICD-10-CM | POA: Diagnosis not present

## 2019-08-07 MED ORDER — ALPRAZOLAM 0.5 MG PO TABS: ORAL_TABLET | ORAL | 2 refills | Status: DC

## 2019-08-07 NOTE — Progress Notes (Signed)
[facility] [place], [place] [address]  Internal MEDICINE  Telephone Visit  Patient Name: Emily Blake  [id number]  IV:[id number]  Date of Service: 08/07/2019  I connected with the patient at 1056 by telephone and verified the patients identity using two identifiers.   I discussed the limitations, risks, security and privacy concerns of performing an evaluation and management service by telephone and the availability of in person appointments. I also discussed with the patient that there may be a patient responsible charge related to the service.  The patient expressed understanding and agrees to proceed.    Chief Complaint  Patient presents with  . Telephone Assessment  . Telephone Screen  . Anxiety  . ADD  . Medication Refill    alprazolam    HPI  Pt  Is seen via telephone.  She is following up for anxiety.  She reports she currently takes 0.5mg  bid as needed.  She doesn't use it everyday, her last RX was sent in June with three refills, and she just took her last dose this week.  She is requesting a refill at this time. She reports good control with symptoms using xanax. She has tried a daily medication in the past, but those made her very emotional and she cried constantly.  Over the years it has present more as panic attacks and using the xanax has been more beneficial to her.      Current Medication: Outpatient Encounter Medications as of 08/07/2019  Medication Sig  . ALPRAZolam (XANAX) 0.5 MG tablet Take 1 tablet po BID prn anxiety.  Marland Kitchen amphetamine-dextroamphetamine (ADDERALL) 10 MG tablet Take 1 tablet (10 mg total) by mouth 2 (two) times daily.  . [DISCONTINUED] ALPRAZolam (XANAX) 0.5 MG tablet Take 1 tablet po BID prn anxiety.  . [DISCONTINUED] metFORMIN (GLUCOPHAGE-XR) 750 MG 24 hr tablet Take 750 mg by mouth daily with breakfast. Take morning and night  . [DISCONTINUED] mupirocin ointment (BACTROBAN) 2 % Place 1 application into the nose 2  (two) times daily.  . [DISCONTINUED] sulfamethoxazole-trimethoprim (BACTRIM DS) 800-160 MG tablet Take 1 tablet by mouth 2 (two) times daily. (Patient not taking: Reported on 08/07/2019)   No facility-administered encounter medications on file as of 08/07/2019.    Surgical History: Past Surgical History:  Procedure Laterality Date  . tubiligation  12/2016    Medical History: Past Medical History:  Diagnosis Date  . Anxiety   . Hydrosalpinx     Family History: Family History  Problem Relation Age of Onset  . Diabetes Mother   . Hypertension Mother   . Hypertension Father   . Hypothyroidism Father   . Breast cancer Neg Hx     Social History   Socioeconomic History  . Marital status: Single    Spouse name: Not on file  . Number of children: Not on file  . Years of education: Not on file  . Highest education level: Not on file  Occupational History  . Not on file  Tobacco Use  . Smoking status: Never Smoker  . Smokeless tobacco: Never Used  Substance and Sexual Activity  . Alcohol use: Not Currently  . Drug use: Not Currently  . Sexual activity: Not on file  Other Topics Concern  . Not on file  Social History Narrative  . Not on file   Social Determinants of Health   Financial Resource Strain:   . Difficulty of Paying Living Expenses: Not on file  Food Insecurity:   . Worried  About Running Out of Food in the Last Year: Not on file  . Ran Out of Food in the Last Year: Not on file  Transportation Needs:   . Lack of Transportation (Medical): Not on file  . Lack of Transportation (Non-Medical): Not on file  Physical Activity:   . Days of Exercise per Week: Not on file  . Minutes of Exercise per Session: Not on file  Stress:   . Feeling of Stress : Not on file  Social Connections:   . Frequency of Communication with Friends and Family: Not on file  . Frequency of Social Gatherings with Friends and Family: Not on file  . Attends Religious Services: Not on  file  . Active Member of Clubs or Organizations: Not on file  . Attends Archivist Meetings: Not on file  . Marital Status: Not on file  Intimate Partner Violence:   . Fear of Current or Ex-Partner: Not on file  . Emotionally Abused: Not on file  . Physically Abused: Not on file  . Sexually Abused: Not on file      Review of Systems  Constitutional: Negative for chills, fatigue and unexpected weight change.  HENT: Negative for congestion, rhinorrhea, sneezing and sore throat.   Eyes: Negative for photophobia, pain and redness.  Respiratory: Negative for cough, chest tightness and shortness of breath.   Cardiovascular: Negative for chest pain and palpitations.  Gastrointestinal: Negative for abdominal pain, constipation, diarrhea, nausea and vomiting.  Endocrine: Negative.   Genitourinary: Negative for dysuria and frequency.  Musculoskeletal: Negative for arthralgias, back pain, joint swelling and neck pain.  Skin: Negative for rash.  Allergic/Immunologic: Negative.   Neurological: Negative for tremors and numbness.  Hematological: Negative for adenopathy. Does not bruise/bleed easily.  Psychiatric/Behavioral: Negative for behavioral problems and sleep disturbance. The patient is not nervous/anxious.     Vital Signs: BP 118/72   Pulse 80   Ht 5\' 2"  (1.575 m)   Wt 162 lb (73.5 kg)   BMI 29.63 kg/m    Observation/Objective:  Well sounding, NAD noted.    Assessment/Plan: 1. Generalized anxiety disorder with panic attacks Continue present management. Symptoms well controlled.  Reviewed risks and possible side effects associated with taking opiates, benzodiazepines and other CNS depressants. Combination of these could cause dizziness and drowsiness. Advised patient not to drive or operate machinery when taking these medications, as patient's and other's life can be at risk and will have consequences. Patient verbalized understanding in this matter. Dependence and  abuse for these drugs will be monitored closely. A Controlled substance policy and procedure is on file which allows [facility] to order a urine drug screen test at any visit. Patient understands and agrees with the plan- ALPRAZolam (XANAX) 0.5 MG tablet; Take 1 tablet po BID prn anxiety.  Dispense: 60 tablet; Refill: 2  2. Attention deficit disorder (ADD) without hyperactivity Ptis currently using adderall PRN to help her with work certification. She does not need refill at this time.   General Counseling: Waver verbalizes understanding of the findings of today's phone visit and agrees with plan of treatment. I have discussed any further diagnostic evaluation that may be needed or ordered today. We also reviewed her medications today. she has been encouraged to call the office with any questions or concerns that should arise related to todays visit.    No orders of the defined types were placed in this encounter.   Meds ordered this encounter  Medications  . ALPRAZolam Duanne Moron)  0.5 MG tablet    Sig: Take 1 tablet po BID prn anxiety.    Dispense:  60 tablet    Refill:  2    Time spent: [place] AGNP-C Internal medicine

## 2019-11-18 NOTE — Telephone Encounter (Signed)
ERROR

## 2020-02-05 ENCOUNTER — Ambulatory Visit: Payer: Managed Care, Other (non HMO) | Admitting: Nurse Practitioner

## 2020-02-26 ENCOUNTER — Telehealth: Payer: Self-pay

## 2020-02-26 NOTE — Telephone Encounter (Signed)
Lmom to confirm and screen for 03-01-20 ov. °

## 2020-02-27 ENCOUNTER — Other Ambulatory Visit: Payer: Self-pay | Admitting: Adult Health

## 2020-02-27 DIAGNOSIS — F410 Panic disorder [episodic paroxysmal anxiety] without agoraphobia: Secondary | ICD-10-CM

## 2020-02-27 NOTE — Telephone Encounter (Signed)
Please review refill for patient.  dbs

## 2020-03-01 ENCOUNTER — Other Ambulatory Visit: Payer: Self-pay

## 2020-03-01 ENCOUNTER — Ambulatory Visit (INDEPENDENT_AMBULATORY_CARE_PROVIDER_SITE_OTHER): Payer: 59 | Admitting: Nurse Practitioner

## 2020-03-01 ENCOUNTER — Encounter: Payer: Self-pay | Admitting: Nurse Practitioner

## 2020-03-01 ENCOUNTER — Telehealth: Payer: Self-pay

## 2020-03-01 VITALS — BP 133/85 | HR 75 | Temp 97.4°F | Resp 16 | Ht 62.0 in | Wt 171.6 lb

## 2020-03-01 DIAGNOSIS — Z124 Encounter for screening for malignant neoplasm of cervix: Secondary | ICD-10-CM

## 2020-03-01 DIAGNOSIS — Z319 Encounter for procreative management, unspecified: Secondary | ICD-10-CM | POA: Diagnosis not present

## 2020-03-01 DIAGNOSIS — Z0001 Encounter for general adult medical examination with abnormal findings: Secondary | ICD-10-CM

## 2020-03-01 DIAGNOSIS — R5383 Other fatigue: Secondary | ICD-10-CM | POA: Diagnosis not present

## 2020-03-01 DIAGNOSIS — R300 Dysuria: Secondary | ICD-10-CM

## 2020-03-01 DIAGNOSIS — Z1231 Encounter for screening mammogram for malignant neoplasm of breast: Secondary | ICD-10-CM

## 2020-03-01 NOTE — Progress Notes (Signed)
[facility] [place], [place] [address]  Internal MEDICINE  Office Visit Note  Patient Name: Emily Blake  [id number]  [id number]  Date of Service: 03/22/2020   Pt is here for routine health maintenance examination  Chief Complaint  Patient presents with  . Annual Exam  . Anxiety  . Quality Metric Gaps    TDAP     The patient presents for health maintenance exam and pap smear today. She states that she is doing well. Has, essentially, stopped taking all prescription medications. Feels as though she does better without them. She is due to have routine, fasting labs. She is also due to have screening mammogram. She states that she has no concerns or complaints today.     Current Medication: Outpatient Encounter Medications as of 03/01/2020  Medication Sig  . [DISCONTINUED] ALPRAZolam (XANAX) 0.5 MG tablet Take 1 tablet po BID prn anxiety. (Patient not taking: Reported on 03/01/2020)  . [DISCONTINUED] amphetamine-dextroamphetamine (ADDERALL) 10 MG tablet Take 1 tablet (10 mg total) by mouth 2 (two) times daily. (Patient not taking: Reported on 03/01/2020)   No facility-administered encounter medications on file as of 03/01/2020.    Surgical History: Past Surgical History:  Procedure Laterality Date  . tubiligation  12/2016    Medical History: Past Medical History:  Diagnosis Date  . Anxiety   . Hydrosalpinx     Family History: Family History  Problem Relation Age of Onset  . Diabetes Mother   . Hypertension Mother   . Hypertension Father   . Hypothyroidism Father   . Breast cancer Neg Hx       Review of Systems  Constitutional: Negative for activity change, chills, fatigue and unexpected weight change.  HENT: Negative for congestion, postnasal drip, rhinorrhea, sneezing and sore throat.   Respiratory: Negative for cough, chest tightness, shortness of breath and wheezing.   Cardiovascular: Negative for chest pain and palpitations.   Gastrointestinal: Negative for abdominal pain, constipation, diarrhea, nausea and vomiting.  Endocrine: Negative for cold intolerance, heat intolerance, polydipsia and polyuria.  Genitourinary: Negative for dysuria, frequency, hematuria, menstrual problem and urgency.  Musculoskeletal: Negative for arthralgias, back pain, joint swelling and neck pain.  Skin: Negative for rash.  Allergic/Immunologic: Negative for environmental allergies.  Neurological: Negative for dizziness, tremors, numbness and headaches.  Hematological: Negative for adenopathy. Does not bruise/bleed easily.  Psychiatric/Behavioral: Negative for behavioral problems (Depression), sleep disturbance and suicidal ideas. The patient is nervous/anxious.      Today's Vitals   03/01/20 1039  BP: 133/85  Pulse: 75  Resp: 16  Temp: (!) 97.4 F (36.3 C)  SpO2: 100%  Weight: 171 lb 9.6 oz (77.8 kg)  Height: _0  (1.575 m)   Body mass index is 31.39 kg/m.  Physical Exam Vitals and nursing note reviewed.  Constitutional:      General: She is not in acute distress.    Appearance: Normal appearance. She is well-developed. She is not diaphoretic.  HENT:     Head: Normocephalic and atraumatic.     Nose: Nose normal.     Mouth/Throat:     Pharynx: No oropharyngeal exudate.  Eyes:     Pupils: Pupils are equal, round, and reactive to light.  Neck:     Thyroid: No thyromegaly.     Vascular: No JVD.     Trachea: No tracheal deviation.  Cardiovascular:     Rate and Rhythm: Normal rate and regular rhythm.     Pulses: Normal pulses.  Heart sounds: Normal heart sounds. No murmur heard.  No friction rub. No gallop.   Pulmonary:     Effort: Pulmonary effort is normal. No respiratory distress.     Breath sounds: Normal breath sounds. No wheezing or rales.  Chest:     Chest wall: No tenderness.     Breasts:        Right: Normal. No swelling, bleeding, inverted nipple, mass, nipple discharge, skin change or tenderness.         Left: Normal. No swelling, bleeding, inverted nipple, mass, nipple discharge, skin change or tenderness.  Abdominal:     General: Bowel sounds are normal.     Palpations: Abdomen is soft.     Tenderness: There is no abdominal tenderness.     Hernia: There is no hernia in the left inguinal area or right inguinal area.  Genitourinary:    General: Normal vulva.     Exam position: Supine.     Labia:        Right: No tenderness or lesion.        Left: No tenderness or lesion.      Vagina: Normal. No vaginal discharge, erythema, tenderness or bleeding.     Cervix: No cervical motion tenderness, discharge, friability or lesion.     Uterus: Normal.      Adnexa: Right adnexa normal and left adnexa normal.     Comments: No tenderness, masses, or organomeglay present during bimanual exam . Musculoskeletal:        General: Normal range of motion.     Cervical back: Normal range of motion and neck supple.  Lymphadenopathy:     Cervical: No cervical adenopathy.     Upper Body:     Right upper body: No axillary adenopathy.     Left upper body: No axillary adenopathy.     Lower Body: No right inguinal adenopathy.  Skin:    General: Skin is warm and dry.  Neurological:     General: No focal deficit present.     Mental Status: She is alert and oriented to person, place, and time.     Cranial Nerves: No cranial nerve deficit.  Psychiatric:        Mood and Affect: Mood normal.        Behavior: Behavior normal.        Thought Content: Thought content normal.        Judgment: Judgment normal.      LABS: Recent Results (from the past 2160 hour(s))  Urinalysis, Routine w reflex microscopic     Status: Abnormal   Collection Time: 03/01/20 10:41 AM  Result Value Ref Range   Specific Gravity, UA 1.009 1.005 - 1.030   pH, UA 8.0 (H) 5.0 - 7.5   Color, UA Yellow Yellow   Appearance Ur Clear Clear   Leukocytes,UA Negative Negative   Protein,UA Negative Negative/Trace   Glucose, UA  Negative Negative   Ketones, UA Negative Negative   RBC, UA Negative Negative   Bilirubin, UA Negative Negative   Urobilinogen, Ur 0.2 0.2 - 1.0 mg/dL   Nitrite, UA Negative Negative   Microscopic Examination Comment     Comment: Microscopic not indicated and not performed.  IGP, Aptima HPV     Status: None   Collection Time: 03/01/20 11:13 AM  Result Value Ref Range   Interpretation NILM     Comment: NEGATIVE FOR INTRAEPITHELIAL LESION OR MALIGNANCY.   Category NIL     Comment: Negative for Intraepithelial  Lesion   Adequacy SECNI     Comment: Satisfactory for evaluation. No endocervical component is identified.   Clinician Provided ICD10 Comment     Comment: Z12.4   Performed by: Comment     Comment: Sonny Masters, Cytotechnologist (ASCP)   Note: Comment     Comment: The Pap smear is a screening test designed to aid in the detection of premalignant and malignant conditions of the uterine cervix.  It is not a diagnostic procedure and should not be used as the sole means of detecting cervical cancer.  Both false-positive and false-negative reports do occur.    Test Methodology Comment     Comment: This liquid based ThinPrep(R) pap test was screened with the use of an image guided system.    HPV Aptima Negative Negative    Comment: This nucleic acid amplification test detects fourteen high-risk HPV types (16,18,31,33,35,39,45,51,52,56,58,59,66,68) without differentiation.   Comprehensive metabolic panel     Status: None   Collection Time: 03/02/20  8:17 AM  Result Value Ref Range   Glucose 83 65 - 99 mg/dL   BUN 12 6 - 24 mg/dL   Creatinine, Ser 0.90 0.57 - 1.00 mg/dL   GFR calc non Af Amer 79 >59 mL/min/1.73   GFR calc Af Amer 91 >59 mL/min/1.73    Comment: **[facility] currently reports eGFR in compliance with the current**   recommendations of the [employer]. [facility] will   update reporting as new guidelines are published from the NKF-ASN   Task force.     BUN/Creatinine Ratio 13 9 - 23   Sodium 140 134 - 144 mmol/L   Potassium 4.4 3.5 - 5.2 mmol/L   Chloride 102 96 - 106 mmol/L   CO2 25 20 - 29 mmol/L   Calcium 9.3 8.7 - 10.2 mg/dL   Total Protein 6.7 6.0 - 8.5 g/dL   Albumin 4.2 3.8 - 4.8 g/dL   Globulin, Total 2.5 1.5 - 4.5 g/dL   Albumin/Globulin Ratio 1.7 1.2 - 2.2   Bilirubin Total 0.4 0.0 - 1.2 mg/dL   Alkaline Phosphatase 81 48 - 121 IU/L   AST 13 0 - 40 IU/L   ALT 11 0 - 32 IU/L  CBC     Status: None   Collection Time: 03/02/20  8:17 AM  Result Value Ref Range   WBC 6.6 3.4 - 10.8 x10E3/uL   RBC 4.59 3.77 - 5.28 x10E6/uL   Hemoglobin 14.1 11.1 - 15.9 g/dL   Hematocrit 41.5 34.0 - 46.6 %   MCV 90 79 - 97 fL   MCH 30.7 26.6 - 33.0 pg   MCHC 34.0 31 - 35 g/dL   RDW 12.0 11.7 - 15.4 %   Platelets 247 150 - 450 x10E3/uL  Lipid Panel With LDL/HDL Ratio     Status: Abnormal   Collection Time: 03/02/20  8:17 AM  Result Value Ref Range   Cholesterol, Total 195 100 - 199 mg/dL   Triglycerides 147 0 - 149 mg/dL   HDL 59 >39 mg/dL   VLDL Cholesterol Cal 26 5 - 40 mg/dL   LDL Chol Calc (NIH) 110 (H) 0 - 99 mg/dL   LDL/HDL Ratio 1.9 0.0 - 3.2 ratio    Comment:                                     LDL/HDL Ratio  Men  Women                               1/2 Avg.Risk  1.0    1.5                                   Avg.Risk  3.6    3.2                                2X Avg.Risk  6.2    5.0                                3X Avg.Risk  8.0    6.1   FSH/LH     Status: None   Collection Time: 03/02/20  8:17 AM  Result Value Ref Range   LH 17.5 mIU/mL    Comment:                     Adult Female:                       Follicular phase      2.4 -  12.6                       Ovulation phase      14.0 -  95.6                       Luteal phase          1.0 -  11.4                       Postmenopausal        7.7 -  58.5    FSH 16.3 mIU/mL    Comment:                     Adult Female:                        Follicular phase      3.5 -  12.5                       Ovulation phase       4.7 -  21.5                       Luteal phase          1.7 -   7.7                       Postmenopausal       25.8 - 134.8   Hgb A1c w/o eAG     Status: None   Collection Time: 03/02/20  8:17 AM  Result Value Ref Range   Hgb A1c MFr Bld 5.0 4.8 - 5.6 %    Comment:          Prediabetes: 5.7 - 6.4          Diabetes: >6.4          Glycemic control for adults with diabetes: <7.0  T4, free     Status: None   Collection Time: 03/02/20  8:17 AM  Result Value Ref Range   Free T4 1.04 0.82 - 1.77 ng/dL  TSH     Status: None   Collection Time: 03/02/20  8:17 AM  Result Value Ref Range   TSH 1.920 0.450 - 4.500 uIU/mL  Prolactin     Status: None   Collection Time: 03/02/20  8:17 AM  Result Value Ref Range   Prolactin 11.8 4.8 - 23.3 ng/mL  Estradiol     Status: None   Collection Time: 03/02/20  8:17 AM  Result Value Ref Range   Estradiol 48.3 pg/mL    Comment:                     Adult Female:                       Follicular phase   16.1 -   166.0                       Ovulation phase    85.8 -   498.0                       Luteal phase       43.8 -   211.0                       Postmenopausal     <6.0 -    54.7                     Pregnancy                       1st trimester     215.0 - >4300.0 Roche ECLIA methodology   VITAMIN D 25 Hydroxy (Vit-D Deficiency, Fractures)     Status: Abnormal   Collection Time: 03/02/20  8:17 AM  Result Value Ref Range   Vit D, 25-Hydroxy 25.6 (L) 30.0 - 100.0 ng/mL    Comment: Vitamin D deficiency has been defined by the [place] and an Endocrine Society practice guideline as a level of serum 25-OH vitamin D less than 20 ng/mL (1,2). The Endocrine Society went on to further define vitamin D insufficiency as a level between 21 and 29 ng/mL (2). 1. IOM (Institute of Medicine). 2010. Dietary reference    intakes for calcium and D. [place]: The    [employer]. 2. Holick MF, Binkley Corn, Bischoff-Ferrari HA, et al.    Evaluation, treatment, and prevention of vitamin D    deficiency: an Endocrine Society clinical practice    guideline. JCEM. 2011 Jul; 96(7):1911-30.     Assessment/Plan: 1. Encounter for general adult medical examination with abnormal findings Annual health maintenance exam and pap smear today.   2. Fatigue, unspecified type Check routine, fasting labs, include thyroid panel.   3. Infertility management Check reproductive hormones with routine labs .  4. Routine cervical smear - IGP, Aptima HPV  5. Encounter for screening mammogram for malignant neoplasm of breast - MM DIGITAL SCREENING BILATERAL; Future  6. Dysuria - Urinalysis, Routine w reflex microscopic  General Counseling: Mirtha verbalizes understanding of the findings of todays visit and agrees with plan of treatment. I have discussed any further diagnostic evaluation that may be needed or ordered today. We also reviewed her  medications today. she has been encouraged to call the office with any questions or concerns that should arise related to todays visit.    Counseling:  This patient was seen by Leretha Pol FNP Collaboration with Dr Lavera Guise as a part of collaborative care agreement  Orders Placed This Encounter  Procedures  . MM DIGITAL SCREENING BILATERAL  . Urinalysis, Routine w reflex microscopic      Total time spent: 45 Minutes  Time spent includes review of chart, medications, test results, and follow up plan with the patient.     Lavera Guise, MD  Internal Medicine

## 2020-03-01 NOTE — Telephone Encounter (Signed)
Called lmom informing patient mammogram is scheduled on 04/15/2020 @ 10:00am at [facility].  [phone]

## 2020-03-02 ENCOUNTER — Other Ambulatory Visit: Payer: Self-pay | Admitting: Nurse Practitioner

## 2020-03-02 LAB — URINALYSIS, ROUTINE W REFLEX MICROSCOPIC
Bilirubin, UA: NEGATIVE
Glucose, UA: NEGATIVE
Ketones, UA: NEGATIVE
Leukocytes,UA: NEGATIVE
Nitrite, UA: NEGATIVE
Protein,UA: NEGATIVE
RBC, UA: NEGATIVE
Specific Gravity, UA: 1.009 (ref 1.005–1.030)
Urobilinogen, Ur: 0.2 mg/dL (ref 0.2–1.0)
pH, UA: 8 — ABNORMAL HIGH (ref 5.0–7.5)

## 2020-03-03 LAB — COMPREHENSIVE METABOLIC PANEL
ALT: 11 IU/L (ref 0–32)
AST: 13 IU/L (ref 0–40)
Albumin/Globulin Ratio: 1.7 (ref 1.2–2.2)
Albumin: 4.2 g/dL (ref 3.8–4.8)
Alkaline Phosphatase: 81 IU/L (ref 48–121)
BUN/Creatinine Ratio: 13 (ref 9–23)
BUN: 12 mg/dL (ref 6–24)
Bilirubin Total: 0.4 mg/dL (ref 0.0–1.2)
CO2: 25 mmol/L (ref 20–29)
Calcium: 9.3 mg/dL (ref 8.7–10.2)
Chloride: 102 mmol/L (ref 96–106)
Creatinine, Ser: 0.9 mg/dL (ref 0.57–1.00)
GFR calc Af Amer: 91 mL/min/{1.73_m2} (ref >59)
GFR calc non Af Amer: 79 mL/min/{1.73_m2} (ref >59)
Globulin, Total: 2.5 g/dL (ref 1.5–4.5)
Glucose: 83 mg/dL (ref 65–99)
Potassium: 4.4 mmol/L (ref 3.5–5.2)
Sodium: 140 mmol/L (ref 134–144)
Total Protein: 6.7 g/dL (ref 6.0–8.5)

## 2020-03-03 LAB — VITAMIN D 25 HYDROXY (VIT D DEFICIENCY, FRACTURES): Vit D, 25-Hydroxy: 25.6 ng/mL — ABNORMAL LOW (ref 30.0–100.0)

## 2020-03-03 LAB — TSH: TSH: 1.92 u[IU]/mL (ref 0.450–4.500)

## 2020-03-03 LAB — CBC
Hematocrit: 41.5 % (ref 34.0–46.6)
Hemoglobin: 14.1 g/dL (ref 11.1–15.9)
MCH: 30.7 pg (ref 26.6–33.0)
MCHC: 34 g/dL (ref 31.5–35.7)
MCV: 90 fL (ref 79–97)
Platelets: 247 10*3/uL (ref 150–450)
RBC: 4.59 x10E6/uL (ref 3.77–5.28)
RDW: 12 % (ref 11.7–15.4)
WBC: 6.6 10*3/uL (ref 3.4–10.8)

## 2020-03-03 LAB — IGP, APTIMA HPV: HPV Aptima: NEGATIVE

## 2020-03-03 LAB — PROLACTIN: Prolactin: 11.8 ng/mL (ref 4.8–23.3)

## 2020-03-03 LAB — LIPID PANEL WITH LDL/HDL RATIO
Cholesterol, Total: 195 mg/dL (ref 100–199)
HDL: 59 mg/dL (ref >39)
LDL Chol Calc (NIH): 110 mg/dL — ABNORMAL HIGH (ref 0–99)
LDL/HDL Ratio: 1.9 ratio (ref 0.0–3.2)
Triglycerides: 147 mg/dL (ref 0–149)
VLDL Cholesterol Cal: 26 mg/dL (ref 5–40)

## 2020-03-03 LAB — T4, FREE: Free T4: 1.04 ng/dL (ref 0.82–1.77)

## 2020-03-03 LAB — FSH/LH
FSH: 16.3 m[IU]/mL
LH: 17.5 m[IU]/mL

## 2020-03-03 LAB — HGB A1C W/O EAG: Hgb A1c MFr Bld: 5 % (ref 4.8–5.6)

## 2020-03-03 LAB — ESTRADIOL: Estradiol: 48.3 pg/mL

## 2020-03-03 NOTE — Progress Notes (Signed)
Please let the patient know that labs all appear normal. Normal cycling ofreproductive hormones. Needs to take OTC vitamin d supplement. Recheck in one year. Thanks.

## 2020-03-03 NOTE — Progress Notes (Signed)
Please also let the patient know that her pap smear was normal. Thanks.

## 2020-03-22 ENCOUNTER — Encounter: Payer: Self-pay | Admitting: Nurse Practitioner

## 2020-03-22 DIAGNOSIS — Z319 Encounter for procreative management, unspecified: Secondary | ICD-10-CM | POA: Insufficient documentation

## 2020-03-22 DIAGNOSIS — Z124 Encounter for screening for malignant neoplasm of cervix: Secondary | ICD-10-CM | POA: Insufficient documentation

## 2020-03-22 DIAGNOSIS — Z1231 Encounter for screening mammogram for malignant neoplasm of breast: Secondary | ICD-10-CM | POA: Insufficient documentation

## 2020-06-15 ENCOUNTER — Ambulatory Visit: Payer: 59 | Admitting: Nurse Practitioner

## 2020-06-15 ENCOUNTER — Other Ambulatory Visit: Payer: Self-pay

## 2020-06-15 ENCOUNTER — Encounter: Payer: Self-pay | Admitting: Nurse Practitioner

## 2020-06-15 VITALS — BP 136/83 | HR 77 | Temp 97.6°F | Resp 16 | Ht 62.0 in | Wt 172.0 lb

## 2020-06-15 DIAGNOSIS — K645 Perianal venous thrombosis: Secondary | ICD-10-CM | POA: Diagnosis not present

## 2020-06-15 DIAGNOSIS — K649 Unspecified hemorrhoids: Secondary | ICD-10-CM | POA: Diagnosis not present

## 2020-06-15 MED ORDER — LIDOCAINE-HYDROCORTISONE ACE 3-2.5 % RE KIT: PACK | RECTAL | 1 refills | Status: DC

## 2020-06-15 NOTE — Progress Notes (Signed)
[facility] [address], [place] [address]  Internal MEDICINE  Office Visit Note  Patient Name: Emily Blake  [id number]  [id number]  Date of Service: 06/17/2020  Chief Complaint  Patient presents with  . Acute Visit  . hemrroid     The patient presents for acute visit. She states that she developed hemorrhoid after having viral illness which caused her to have nausea and vomiting. She states that she felt it "pop" out during episode of vomiting. She states that nausea and vomiting have subsided. However, the hemorrhoid has become larger and more painful. Feels tight. She states that her bowel moments have been normal, no constipation or hard stools since hemorrhoid was first noted. She denies presence of blood. She states that rectum is very tender. Having a hard time sitting or standing comfortably. States that she sits a lot for her job. 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   Pt is here for a sick visit.     Current Medication:  Outpatient Encounter Medications as of 06/15/2020  Medication Sig  . ALPRAZolam (XANAX) 0.5 MG tablet TAKE 1 TABLET BY MOUTH TWICE A DAY AS NEEDED FOR ANXIETY (Patient not taking: Reported on 06/15/2020)  . Lidocaine-Hydrocortisone Ace 3-2.5 % KIT Apply to rectal area BID and prn for severe hemorrhoids.   No facility-administered encounter medications on file as of 06/15/2020.      Medical History: Past Medical History:  Diagnosis Date  . Anxiety   . Hydrosalpinx      Today's Vitals   06/15/20 1545  BP: 136/83  Pulse: 77  Resp: 16  Temp: 97.6 F (36.4 C)  SpO2: 99%  Weight: 172 lb (78 kg)  Height: _0  (1.575 m)   Body mass index is 31.46 kg/m.  Review of Systems  Constitutional: Negative for activity change, chills, fatigue and unexpected weight change.  HENT: Negative for congestion, postnasal drip, rhinorrhea, sneezing and sore throat.   Respiratory: Negative for cough,  chest tightness and shortness of breath.   Cardiovascular: Negative for chest pain and palpitations.  Gastrointestinal: Positive for rectal pain. Negative for abdominal pain, constipation, diarrhea, nausea and vomiting.       Hemorrhoid which is getting larger, feels tight, and is very tender.   Musculoskeletal: Negative for arthralgias, back pain, joint swelling and neck pain.  Skin: Negative for rash.  Neurological: Negative for tremors and numbness.  Hematological: Negative for adenopathy. Does not bruise/bleed easily.  Psychiatric/Behavioral: Negative for behavioral problems (Depression), sleep disturbance and suicidal ideas. The patient is not nervous/anxious.     Physical Exam Vitals and nursing note reviewed.  Constitutional:      General: She is not in acute distress.    Appearance: Normal appearance. She is well-developed. She is not diaphoretic.  HENT:     Head: Normocephalic and atraumatic.     Mouth/Throat:     Pharynx: No oropharyngeal exudate.  Eyes:     Pupils: Pupils are equal, round, and reactive to light.  Neck:     Thyroid: No thyromegaly.     Vascular: No JVD.     Trachea: No tracheal deviation.  Cardiovascular:     Rate and Rhythm: Normal rate and regular rhythm.     Heart sounds: Normal heart sounds. No murmur heard.  No friction rub. No gallop.   Pulmonary:     Effort: Pulmonary effort is normal. No respiratory distress.     Breath sounds: No wheezing  or rales.  Chest:     Chest wall: No tenderness.  Abdominal:     Palpations: Abdomen is soft.  Genitourinary:    Rectum: Tenderness, external hemorrhoid and internal hemorrhoid present.    Musculoskeletal:        General: Normal range of motion.     Cervical back: Normal range of motion and neck supple.  Lymphadenopathy:     Cervical: No cervical adenopathy.  Skin:    General: Skin is warm and dry.  Neurological:     Mental Status: She is alert and oriented to person, place, and time.     Cranial  Nerves: No cranial nerve deficit.  Psychiatric:        Behavior: Behavior normal.        Thought Content: Thought content normal.        Judgment: Judgment normal.    Assessment/Plan: 1. Thrombosed hemorrhoids Getting worse and more painful. Refer to general surgery for further evaluation and treatment.  - Ambulatory referral to General Surgery  2. Hemorrhoids, unspecified hemorrhoid type Trial lidocaine/hydrocortisone rectal treatment. Apply BID and after bowel movements to reduce pain. Recommended stool softeners to prevent constipation and hard stools.  - Lidocaine-Hydrocortisone Ace 3-2.5 % KIT; Apply to rectal area BID and prn for severe hemorrhoids.  Dispense: 1 kit; Refill: 1  General Counseling: Emily Blake verbalizes understanding of the findings of todays visit and agrees with plan of treatment. I have discussed any further diagnostic evaluation that may be needed or ordered today. We also reviewed her medications today. she has been encouraged to call the office with any questions or concerns that should arise related to todays visit.    Counseling:  This patient was seen by Leretha Pol FNP Collaboration with Dr Lavera Guise as a part of collaborative care agreement  Orders Placed This Encounter  Procedures  . Ambulatory referral to General Surgery    Meds ordered this encounter  Medications  . Lidocaine-Hydrocortisone Ace 3-2.5 % KIT    Sig: Apply to rectal area BID and prn for severe hemorrhoids.    Dispense:  1 kit    Refill:  1    Please fill as alternative preferred by her insurance.    Order Specific Question:   Supervising Provider    Answer:   Lavera Guise [[id number]]    Time spent: 30 Minutes

## 2020-06-17 DIAGNOSIS — K649 Unspecified hemorrhoids: Secondary | ICD-10-CM | POA: Insufficient documentation

## 2021-01-12 ENCOUNTER — Other Ambulatory Visit: Payer: Self-pay | Admitting: Obstetrics and Gynecology

## 2021-02-15 NOTE — H&P (Signed)
Emily Blake is a 45 y.o. female here for Pre Op Consulting   Referring provider: Genice Rouge*   History of Present Illness: Patient returns for an ultrasound with follow up on further plans. [place] indications: plan for TLH/BS with lysis of adhesions, possible ovarian cyst removal onn LEFT, hx of hydrosalpinx, ongoing pelvic pain and pressure, s/p IVF & no longer desires conception. Patient was last seen in 12/2020, at which time we discussed hysterectomy vs bilateral salpingectomy with adhesiolysis. Patient is no longer planning REI intervention. She desires surgical management of her longstanding hx of pelvic pain with hydrosalpinx.   Workup:  EMBx: today, results pending Last pap: 02/2020 neg/neg   TVUS 12/29/2020: Uterus anteverted=7.67 x 4.06 x 4.73cm; Fibroid seen: 1) posterior to endometrium = 1.1cm Endometrium=9.50mm RO appears wnl;  LO contains 2 cysts:1)complex hemorrhagic septated=3.6cm; septations=0.07cm, 0.08cm    2)collapsed corpus luteum=1.7cm Lt hydrosalpinx(lateral to Lt ovary)=4.03 x 1.62 x 4.12cm No free fluid seen   Pertinent Hx: -ER visits x2 for hydrosalpinx pain  -S/p BTL/cauterization of tubes in 12/2016 after initial hydrosalpinx/PID and in context of trying IVF             -Pt had hydrosalpinx pain ever since IVF; she believes this was trigger   -S/p IVF implantation multiple times, miscarriage with in 2019 -Last pap smear: 02/2020 neg/neg -Hx of LEEP in 2006; s/p severe PID a few days after this -Hx of furuncle of left breast -Hx of hemorrhoids -Anxiety, hx of anxiety attacks    Past Medical History:  has a past medical history of Anxiety (2005), History of in vitro fertilization, and Hydrosalpinx (2018).  Past Surgical History:  has a past surgical history that includes lysis of adhesions; bilateral tubal cautery; Pelvic laparoscopy (12/2016); Hysteroscopy (12/2016); and Tubal ligation. Family History: family history includes High blood pressure  (Hypertension) in her father and mother; Thyroid disease in her father. Social History:  reports that she has never smoked. She has never used smokeless tobacco. She reports previous alcohol use. She reports that she does not use drugs. 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       Allergies: is allergic to amoxicillin. Medications: Current Outpatient Medications:    norethindrone-e.estradioL-iron (LO LOESTRIN FE) 1 mg-10 mcg (24)/10 mcg (2), Take 1 tablet by mouth once daily, Disp: 84 tablet, Rfl: 3   ethynodiol-ethinyl estradiol (KELNOR 1/35E, 28,) 1-35 mg-mcg tablet, Take 1 tablet by mouth once daily (Patient not taking: Reported on 02/15/2021), Disp: 28 tablet, Rfl: 2   norethindrone-ethinyl estradiol-iron (LOESTRIN 24 FE) 1 mg-20 mcg (24)/75 mg (4) tablet, Take 1 tablet by mouth once daily (Patient not taking: Reported on 02/15/2021), Disp: 28 tablet, Rfl: 11   Review of Systems: No SOB, no palpitations or chest pain, no new lower extremity edema, no nausea or vomiting or bowel or bladder complaints. See HPI for gyn specific  ROS.    Exam:    BP 102/70   Pulse 102   Ht 157.5 cm (5\' 2" )   Wt 80.6 kg (177 lb 12.8 oz)   BMI 32.52 kg/m    Constitutional:  General appearance: Well nourished, well developed female in no acute distress.  Neuro/psych:  Normal mood and affect. No gross motor deficits. Neck:  Supple, normal appearance.  Respiratory:  Normal respiratory effort, no use of accessory muscles Skin:  No visible rashes or external lesions   Pelvic:              External genitalia: vulva and labia without lesions, Tanner stage 5             Urethra: no prolapse             Vagina: normal  physiologic d/c             Cervix: no lesions, no cervical motion tenderness               Uterus: normal size shape and contour, non-tender             Adnexa: no mass,  non-tender               Rectovaginal: external exam normal    Endometrial biopsy: The cervix was cleaned with betadine, topical Hurriciane spray applied, and a single tooth tenaculum is applied to the anterior cervix. The Pipelle catheter was placed into the endometrial cavity. It sounds to 7 cm and scant tissue was removed.    Impression:    The primary encounter diagnosis was Preop examination. Diagnoses of Hydrosalpinx, Pelvic pressure in female, Pelvic pain in female, and Cyst of left ovary were also pertinent to this visit.   Plan:    -EMBx collected today   1. Hydrosalpinx, Pelvic Pain, Pelvic Pressure, Fertility no longer desired -Patient returns for a preoperative discussion regarding her plans to proceed with surgical treatment of her Hydrosalpinx, pelvic pain, pelvic pressure, fertility no longer desired by total laparoscopic hysterectomy with bilateral salpingectomy with lysis of adhesions procedure and left ovarian cystectomy.  We may perform a cystoscopy to evaluate the urinary tract after the procedure, if surgically indicated for uro tract integrity.    The patient and I discussed the technical aspects of the procedure including the potential for risks and complications.  These include but are not limited to the risk of infection requiring post-operative antibiotics or further procedures.  We talked about the risk of injury to adjacent organs including bladder, bowel, ureter, blood vessels or nerves.  We talked about the need to convert to an open incision.  We talked about the possible need for blood transfusion.  We talked about postop complications such as thromboembolic or cardiopulmonary complications.  All of her questions were answered. The appropriate consents were signed.    Specific Peri-operative  Considerations:  - Consent: obtained today - Health Maintenance: up to date - Labs: CBC, CMP preoperatively - Studies: EKG, CXR preoperatively - Bowel Preparation: None required - Abx:  Ancef 2g - VTE ppx: SCDs perioperatively - Glucose Protocol: n/a - Beta-blockade: n/a     Diagnoses and all orders for this visit:   Preop examination   Hydrosalpinx -     Pathology Report - [facility]   Pelvic pressure in female -     Pathology Report - [facility]   Pelvic pain in female -     Pathology Report - [facility]

## 2021-02-22 ENCOUNTER — Encounter
Admission: RE | Admit: 2021-02-22 | Discharge: 2021-02-22 | Disposition: A | Discharge status: Home / Self Care | Payer: 59 | Source: Ambulatory Visit | Attending: Obstetrics and Gynecology | Admitting: Obstetrics and Gynecology

## 2021-02-22 ENCOUNTER — Other Ambulatory Visit: Payer: Self-pay

## 2021-02-22 DIAGNOSIS — Z01812 Encounter for preprocedural laboratory examination: Secondary | ICD-10-CM | POA: Insufficient documentation

## 2021-02-22 HISTORY — DX: Unspecified hemorrhoids: K64.9

## 2021-02-22 LAB — BASIC METABOLIC PANEL
Anion gap: 7 (ref 5–15)
BUN: 15 mg/dL (ref 6–20)
CO2: 25 mmol/L (ref 22–32)
Calcium: 9 mg/dL (ref 8.9–10.3)
Chloride: 105 mmol/L (ref 98–111)
Creatinine, Ser: 0.78 mg/dL (ref 0.44–1.00)
GFR, Estimated: >60 mL/min (ref >60)
Glucose, Bld: 94 mg/dL (ref 70–99)
Potassium: 4.2 mmol/L (ref 3.5–5.1)
Sodium: 137 mmol/L (ref 135–145)

## 2021-02-22 LAB — TYPE AND SCREEN
ABO/RH(D): O POS
Antibody Screen: NEGATIVE

## 2021-02-22 LAB — CBC
HCT: 39.4 % (ref 36.0–46.0)
Hemoglobin: 13.7 g/dL (ref 12.0–15.0)
MCH: 30.9 pg (ref 26.0–34.0)
MCHC: 34.8 g/dL (ref 30.0–36.0)
MCV: 88.7 fL (ref 80.0–100.0)
Platelets: 260 10*3/uL (ref 150–400)
RBC: 4.44 MIL/uL (ref 3.87–5.11)
RDW: 11.4 % — ABNORMAL LOW (ref 11.5–15.5)
WBC: 10.1 10*3/uL (ref 4.0–10.5)
nRBC: 0 % (ref 0.0–0.2)

## 2021-02-22 NOTE — Patient Instructions (Addendum)
Your procedure is scheduled on: Monday, July 18 Report to the Registration Desk on the 1st floor of the [facility]. To find out your arrival time, please call [phone] between 1PM - 3PM on: Friday, July 15  REMEMBER: Instructions that are not followed completely may result in serious medical risk, up to and including death; or upon the discretion of your surgeon and anesthesiologist your surgery may need to be rescheduled.  Do not eat food after midnight the night before surgery.  No gum chewing, lozengers or hard candies.  You may however, drink CLEAR liquids up to 2 hours before you are scheduled to arrive for your surgery. Do not drink anything within 2 hours of your scheduled arrival time.  Clear liquids include: - water  - apple juice without pulp - gatorade (not RED, PURPLE, OR BLUE) - black coffee or tea (Do NOT add milk or creamers to the coffee or tea) Do NOT drink anything that is not on this list.  DO NOT TAKE ANY MEDICATIONS THE MORNING OF SURGERY  One week prior to surgery: starting today, July 12 Stop Anti-inflammatories (NSAIDS) such as Advil, Aleve, Ibuprofen, Motrin, Naproxen, Naprosyn and Aspirin based products such as Excedrin, Goodys Powder, BC Powder. Stop ANY OVER THE COUNTER supplements until after surgery. You may however, continue to take Tylenol if needed for pain up until the day of surgery.  No Alcohol for 24 hours before or after surgery.  No Smoking including e-cigarettes for 24 hours prior to surgery.  No chewable tobacco products for at least 6 hours prior to surgery.  No nicotine patches on the day of surgery.  Do not use any "recreational" drugs for at least a week prior to your surgery.  Please be advised that the combination of cocaine and anesthesia may have negative outcomes, up to and including death. If you test positive for cocaine, your surgery will be cancelled.  On the morning of surgery brush your teeth with toothpaste and  water, you may rinse your mouth with mouthwash if you wish. Do not swallow any toothpaste or mouthwash.  Do not wear jewelry, make-up, hairpins, clips or nail polish.  Do not wear lotions, powders, or perfumes.   Do not shave body from the neck down 48 hours prior to surgery just in case you cut yourself which could leave a site for infection.  Also, freshly shaved skin may become irritated if using the CHG soap.  Contact lenses, hearing aids and dentures may not be worn into surgery.  Do not bring valuables to the hospital. [facility] is not responsible for any missing/lost belongings or valuables.   Use CHG Soap as directed on instruction sheet.  Notify your doctor if there is any change in your medical condition (cold, fever, infection).  Wear comfortable clothing (specific to your surgery type) to the hospital.  After surgery, you can help prevent lung complications by doing breathing exercises.  Take deep breaths and cough every 1-2 hours. Your doctor may order a device called an Incentive Spirometer to help you take deep breaths. When coughing or sneezing, hold a pillow firmly against your incision with both hands. This is called "splinting." Doing this helps protect your incision. It also decreases belly discomfort.  If you are being discharged the day of surgery, you will not be allowed to drive home. You will need a responsible adult (18 years or older) to drive you home and stay with you that night.   If you are taking  public transportation, you will need to have a responsible adult (18 years or older) with you. Please confirm with your physician that it is acceptable to use public transportation.   Please call the [place] [facility] at [phone] if you have any questions about these instructions.  Surgery Visitation Policy:  Patients undergoing a surgery or procedure may have one family member or support person with them as long as that person is not  COVID-19 positive or experiencing its symptoms.  That person may remain in the waiting area during the procedure.

## 2021-02-28 ENCOUNTER — Encounter: Payer: Self-pay | Admitting: Obstetrics and Gynecology

## 2021-02-28 ENCOUNTER — Ambulatory Visit
Admission: RE | Admit: 2021-02-28 | Discharge: 2021-02-28 | Disposition: A | Discharge status: Home / Self Care | Payer: 59 | Attending: Obstetrics and Gynecology | Admitting: Obstetrics and Gynecology

## 2021-02-28 ENCOUNTER — Ambulatory Visit: Payer: 59 | Admitting: Urgent Care

## 2021-02-28 ENCOUNTER — Encounter
Admission: RE | Disposition: A | Discharge status: Home / Self Care | Payer: Self-pay | Source: Home / Self Care | Attending: Obstetrics and Gynecology

## 2021-02-28 ENCOUNTER — Ambulatory Visit: Payer: 59 | Admitting: Anesthesiology

## 2021-02-28 ENCOUNTER — Other Ambulatory Visit: Payer: Self-pay

## 2021-02-28 DIAGNOSIS — G8929 Other chronic pain: Secondary | ICD-10-CM | POA: Diagnosis present

## 2021-02-28 DIAGNOSIS — N800 Endometriosis of uterus: Secondary | ICD-10-CM | POA: Insufficient documentation

## 2021-02-28 DIAGNOSIS — Z793 Long term (current) use of hormonal contraceptives: Secondary | ICD-10-CM | POA: Insufficient documentation

## 2021-02-28 DIAGNOSIS — N72 Inflammatory disease of cervix uteri: Secondary | ICD-10-CM | POA: Diagnosis not present

## 2021-02-28 DIAGNOSIS — D251 Intramural leiomyoma of uterus: Secondary | ICD-10-CM | POA: Insufficient documentation

## 2021-02-28 DIAGNOSIS — N7011 Chronic salpingitis: Secondary | ICD-10-CM | POA: Diagnosis not present

## 2021-02-28 DIAGNOSIS — Z880 Allergy status to penicillin: Secondary | ICD-10-CM | POA: Insufficient documentation

## 2021-02-28 HISTORY — PX: TOTAL LAPAROSCOPIC HYSTERECTOMY WITH SALPINGECTOMY: SHX6742

## 2021-02-28 LAB — ABO/RH: ABO/RH(D): O POS

## 2021-02-28 LAB — POCT PREGNANCY, URINE: Preg Test, Ur: NEGATIVE

## 2021-02-28 SURGERY — HYSTERECTOMY, TOTAL, LAPAROSCOPIC, WITH SALPINGECTOMY
Anesthesia: General | Laterality: Left

## 2021-02-28 MED ORDER — SUCCINYLCHOLINE CHLORIDE 20 MG/ML IJ SOLN
INTRAMUSCULAR | Status: DC | PRN
  Administered 2021-02-28: 140 mg via INTRAVENOUS

## 2021-02-28 MED ORDER — MEPERIDINE HCL 25 MG/ML IJ SOLN
6.2500 mg | INTRAMUSCULAR | Status: DC | PRN
  Administered 2021-02-28: 12.5 mg via INTRAVENOUS

## 2021-02-28 MED ORDER — HYDROMORPHONE HCL 1 MG/ML IJ SOLN
0.2500 mg | INTRAMUSCULAR | Status: DC | PRN
  Administered 2021-02-28 (×2): 0.25 mg via INTRAVENOUS
  Administered 2021-02-28: 0.5 mg via INTRAVENOUS
  Administered 2021-02-28: 0.25 mg via INTRAVENOUS

## 2021-02-28 MED ORDER — MIDAZOLAM HCL 2 MG/2ML IJ SOLN
INTRAMUSCULAR | Status: AC
  Filled 2021-02-28: qty 2

## 2021-02-28 MED ORDER — GABAPENTIN 300 MG PO CAPS
ORAL_CAPSULE | ORAL | Status: AC
  Administered 2021-02-28: 300 mg via ORAL
  Filled 2021-02-28: qty 1

## 2021-02-28 MED ORDER — GABAPENTIN 800 MG PO TABS: 800.0000 mg | ORAL_TABLET | Freq: Every day | ORAL | 0 refills | Status: AC

## 2021-02-28 MED ORDER — OXYCODONE HCL 5 MG PO TABS: 5.0000 mg | ORAL_TABLET | ORAL | 0 refills | Status: AC | PRN

## 2021-02-28 MED ORDER — ACETAMINOPHEN 500 MG PO TABS: 1000.0000 mg | ORAL_TABLET | ORAL | Status: AC

## 2021-02-28 MED ORDER — HYDROMORPHONE HCL 1 MG/ML IJ SOLN
INTRAMUSCULAR | Status: AC
  Administered 2021-02-28: 0.5 mg via INTRAVENOUS
  Filled 2021-02-28: qty 1

## 2021-02-28 MED ORDER — ACETAMINOPHEN 10 MG/ML IV SOLN: 1000.0000 mg | Freq: Once | INTRAVENOUS | Status: DC | PRN

## 2021-02-28 MED ORDER — 0.9 % SODIUM CHLORIDE (POUR BTL) OPTIME
TOPICAL | Status: DC | PRN
  Administered 2021-02-28: 1000 mL

## 2021-02-28 MED ORDER — PROPOFOL 10 MG/ML IV BOLUS
INTRAVENOUS | Status: DC | PRN
  Administered 2021-02-28: 160 mg via INTRAVENOUS

## 2021-02-28 MED ORDER — ACETAMINOPHEN 160 MG/5ML PO SOLN
325.0000 mg | ORAL | Status: DC | PRN
  Filled 2021-02-28: qty 20.3

## 2021-02-28 MED ORDER — FENTANYL CITRATE (PF) 100 MCG/2ML IJ SOLN
INTRAMUSCULAR | Status: DC | PRN
  Administered 2021-02-28: 100 ug via INTRAVENOUS
  Administered 2021-02-28: 50 ug via INTRAVENOUS

## 2021-02-28 MED ORDER — CHLORHEXIDINE GLUCONATE 0.12 % MT SOLN
OROMUCOSAL | Status: AC
  Administered 2021-02-28: 15 mL via OROMUCOSAL
  Filled 2021-02-28: qty 15

## 2021-02-28 MED ORDER — BUPIVACAINE HCL 0.5 % IJ SOLN
INTRAMUSCULAR | Status: DC | PRN
  Administered 2021-02-28: 12 mL

## 2021-02-28 MED ORDER — SCOPOLAMINE 1 MG/3DAYS TD PT72
1.0000 | MEDICATED_PATCH | Freq: Once | TRANSDERMAL | Status: DC
  Administered 2021-02-28: 1.5 mg via TRANSDERMAL

## 2021-02-28 MED ORDER — SCOPOLAMINE 1 MG/3DAYS TD PT72
MEDICATED_PATCH | TRANSDERMAL | Status: AC
  Filled 2021-02-28: qty 1

## 2021-02-28 MED ORDER — PROMETHAZINE HCL 25 MG/ML IJ SOLN: 12.5000 mg | Freq: Once | INTRAMUSCULAR | Status: DC | PRN

## 2021-02-28 MED ORDER — HYDROMORPHONE HCL 1 MG/ML IJ SOLN
INTRAMUSCULAR | Status: DC | PRN
  Administered 2021-02-28: 1 mg via INTRAVENOUS

## 2021-02-28 MED ORDER — KETAMINE HCL 10 MG/ML IJ SOLN
INTRAMUSCULAR | Status: DC | PRN
  Administered 2021-02-28: 30 mg via INTRAVENOUS

## 2021-02-28 MED ORDER — ROCURONIUM BROMIDE 100 MG/10ML IV SOLN
INTRAVENOUS | Status: DC | PRN
  Administered 2021-02-28: 45 mg via INTRAVENOUS
  Administered 2021-02-28: 5 mg via INTRAVENOUS

## 2021-02-28 MED ORDER — MEPERIDINE HCL 25 MG/ML IJ SOLN
INTRAMUSCULAR | Status: AC
  Administered 2021-02-28: 12.5 mg via INTRAVENOUS
  Filled 2021-02-28: qty 1

## 2021-02-28 MED ORDER — PHENYLEPHRINE HCL (PRESSORS) 10 MG/ML IV SOLN
INTRAVENOUS | Status: DC | PRN
  Administered 2021-02-28 (×3): 100 ug via INTRAVENOUS

## 2021-02-28 MED ORDER — GABAPENTIN 300 MG PO CAPS
300.0000 mg | ORAL_CAPSULE | ORAL | Status: AC
Start: 2021-02-28 — End: 2021-02-28

## 2021-02-28 MED ORDER — ACETAMINOPHEN 500 MG PO TABS: 1000.0000 mg | ORAL_TABLET | Freq: Four times a day (QID) | ORAL | 0 refills | Status: AC

## 2021-02-28 MED ORDER — BUPIVACAINE HCL (PF) 0.5 % IJ SOLN
INTRAMUSCULAR | Status: AC
  Filled 2021-02-28: qty 30

## 2021-02-28 MED ORDER — HYDROMORPHONE HCL 1 MG/ML IJ SOLN
INTRAMUSCULAR | Status: AC
  Administered 2021-02-28: 0.25 mg via INTRAVENOUS
  Filled 2021-02-28: qty 1

## 2021-02-28 MED ORDER — FENTANYL CITRATE (PF) 100 MCG/2ML IJ SOLN
INTRAMUSCULAR | Status: AC
  Filled 2021-02-28: qty 2

## 2021-02-28 MED ORDER — DOCUSATE SODIUM 100 MG PO CAPS: 100.0000 mg | ORAL_CAPSULE | Freq: Two times a day (BID) | ORAL | 0 refills | Status: AC

## 2021-02-28 MED ORDER — IBUPROFEN 800 MG PO TABS: 800.0000 mg | ORAL_TABLET | Freq: Three times a day (TID) | ORAL | 1 refills | Status: AC

## 2021-02-28 MED ORDER — FAMOTIDINE 20 MG PO TABS
ORAL_TABLET | ORAL | Status: AC
  Administered 2021-02-28: 20 mg via ORAL
  Filled 2021-02-28: qty 1

## 2021-02-28 MED ORDER — DEXAMETHASONE SODIUM PHOSPHATE 10 MG/ML IJ SOLN
INTRAMUSCULAR | Status: DC | PRN
  Administered 2021-02-28: 10 mg via INTRAVENOUS

## 2021-02-28 MED ORDER — GLYCOPYRROLATE 0.2 MG/ML IJ SOLN
INTRAMUSCULAR | Status: DC | PRN
  Administered 2021-02-28: .2 mg via INTRAVENOUS

## 2021-02-28 MED ORDER — ACETAMINOPHEN 500 MG PO TABS
ORAL_TABLET | ORAL | Status: AC
  Administered 2021-02-28: 1000 mg via ORAL
  Filled 2021-02-28: qty 2

## 2021-02-28 MED ORDER — CHLORHEXIDINE GLUCONATE 0.12 % MT SOLN: 15.0000 mL | Freq: Once | OROMUCOSAL | Status: AC

## 2021-02-28 MED ORDER — LIDOCAINE HCL (CARDIAC) PF 100 MG/5ML IV SOSY
PREFILLED_SYRINGE | INTRAVENOUS | Status: DC | PRN
  Administered 2021-02-28: 60 mg via INTRAVENOUS

## 2021-02-28 MED ORDER — SEVOFLURANE IN SOLN
RESPIRATORY_TRACT | Status: AC
  Filled 2021-02-28: qty 250

## 2021-02-28 MED ORDER — OXYCODONE HCL 5 MG/5ML PO SOLN: 5.0000 mg | Freq: Once | ORAL | Status: AC | PRN

## 2021-02-28 MED ORDER — SILVER SULFADIAZINE 1 % EX CREA
TOPICAL_CREAM | CUTANEOUS | Status: AC
  Filled 2021-02-28: qty 85

## 2021-02-28 MED ORDER — MIDAZOLAM HCL 2 MG/2ML IJ SOLN
INTRAMUSCULAR | Status: DC | PRN
  Administered 2021-02-28: 2 mg via INTRAVENOUS

## 2021-02-28 MED ORDER — LACTATED RINGERS IV SOLN: INTRAVENOUS | Status: DC

## 2021-02-28 MED ORDER — ORAL CARE MOUTH RINSE: 15.0000 mL | Freq: Once | OROMUCOSAL | Status: AC

## 2021-02-28 MED ORDER — PROPOFOL 500 MG/50ML IV EMUL
INTRAVENOUS | Status: AC
  Filled 2021-02-28: qty 50

## 2021-02-28 MED ORDER — SILVER SULFADIAZINE 1 % EX CREA
TOPICAL_CREAM | CUTANEOUS | Status: DC | PRN
  Administered 2021-02-28: 1 via TOPICAL

## 2021-02-28 MED ORDER — ACETAMINOPHEN 325 MG PO TABS: 325.0000 mg | ORAL_TABLET | ORAL | Status: DC | PRN

## 2021-02-28 MED ORDER — CEFAZOLIN SODIUM-DEXTROSE 2-4 GM/100ML-% IV SOLN
2.0000 g | INTRAVENOUS | Status: AC
  Administered 2021-02-28: 2 g via INTRAVENOUS

## 2021-02-28 MED ORDER — ONDANSETRON HCL 4 MG/2ML IJ SOLN
INTRAMUSCULAR | Status: DC | PRN
  Administered 2021-02-28 (×2): 4 mg via INTRAVENOUS

## 2021-02-28 MED ORDER — HYDROMORPHONE HCL 1 MG/ML IJ SOLN
INTRAMUSCULAR | Status: AC
  Filled 2021-02-28: qty 1

## 2021-02-28 MED ORDER — CEFAZOLIN SODIUM-DEXTROSE 2-4 GM/100ML-% IV SOLN
INTRAVENOUS | Status: AC
  Filled 2021-02-28: qty 100

## 2021-02-28 MED ORDER — FAMOTIDINE 20 MG PO TABS: 20.0000 mg | ORAL_TABLET | Freq: Once | ORAL | Status: AC

## 2021-02-28 MED ORDER — POVIDONE-IODINE 10 % EX SWAB: 2.0000 "application " | Freq: Once | CUTANEOUS | Status: DC

## 2021-02-28 MED ORDER — DEXMEDETOMIDINE (PRECEDEX) IN NS 20 MCG/5ML (4 MCG/ML) IV SYRINGE
PREFILLED_SYRINGE | INTRAVENOUS | Status: DC | PRN
  Administered 2021-02-28: 12 ug via INTRAVENOUS
  Administered 2021-02-28: 8 ug via INTRAVENOUS

## 2021-02-28 MED ORDER — KETAMINE HCL 50 MG/5ML IJ SOSY
PREFILLED_SYRINGE | INTRAMUSCULAR | Status: AC
  Filled 2021-02-28: qty 5

## 2021-02-28 MED ORDER — SUGAMMADEX SODIUM 200 MG/2ML IV SOLN
INTRAVENOUS | Status: DC | PRN
  Administered 2021-02-28: 300 mg via INTRAVENOUS

## 2021-02-28 MED ORDER — OXYCODONE HCL 5 MG PO TABS
ORAL_TABLET | ORAL | Status: AC
  Administered 2021-02-28: 5 mg via ORAL
  Filled 2021-02-28: qty 1

## 2021-02-28 MED ORDER — OXYCODONE HCL 5 MG PO TABS: 5.0000 mg | ORAL_TABLET | Freq: Once | ORAL | Status: AC | PRN

## 2021-02-28 MED ORDER — EPHEDRINE SULFATE 50 MG/ML IJ SOLN
INTRAMUSCULAR | Status: DC | PRN
  Administered 2021-02-28 (×3): 5 mg via INTRAVENOUS

## 2021-02-28 SURGICAL SUPPLY — 78 items
ADH SKN CLS APL DERMABOND .7 (GAUZE/BANDAGES/DRESSINGS) ×2
APL PRP STRL LF DISP 70% ISPRP (MISCELLANEOUS) ×2
APL SRG 38 LTWT LNG FL B (MISCELLANEOUS)
APPLICATOR ARISTA FLEXITIP XL (MISCELLANEOUS) IMPLANT
BACTOSHIELD CHG 4% 4OZ (MISCELLANEOUS) ×1
BAG DRN RND TRDRP ANRFLXCHMBR (UROLOGICAL SUPPLIES) ×4
BAG SPEC RTRVL LRG 6X4 10 (ENDOMECHANICALS)
BAG URINE DRAIN 2000ML AR STRL (UROLOGICAL SUPPLIES) ×6 IMPLANT
BLADE SURG SZ11 CARB STEEL (BLADE) ×3 IMPLANT
CATH FOLEY 2WAY  5CC 16FR (CATHETERS) ×1
CATH FOLEY 2WAY 5CC 16FR (CATHETERS) ×2
CATH URTH 16FR FL 2W BLN LF (CATHETERS) ×2 IMPLANT
CHLORAPREP W/TINT 26 (MISCELLANEOUS) ×3 IMPLANT
CORD MONOPOLAR M/FML 12FT (MISCELLANEOUS) ×3 IMPLANT
COUNTER NEEDLE 20/40 LG (NEEDLE) ×3 IMPLANT
COVER LIGHT HANDLE STERIS (MISCELLANEOUS) ×6 IMPLANT
DERMABOND ADVANCED (GAUZE/BANDAGES/DRESSINGS) ×1
DERMABOND ADVANCED .7 DNX12 (GAUZE/BANDAGES/DRESSINGS) ×2 IMPLANT
DEVICE SUTURE ENDOST 10MM (ENDOMECHANICALS) ×3 IMPLANT
DRAPE GENERAL ENDO 106X123.5 (DRAPES) ×3 IMPLANT
DRAPE STERI POUCH LG 24X46 STR (DRAPES) IMPLANT
DRSG TEGADERM 2-3/8X2-3/4 SM (GAUZE/BANDAGES/DRESSINGS) ×12 IMPLANT
GAUZE 4X4 16PLY ~~LOC~~+RFID DBL (SPONGE) ×6 IMPLANT
GLOVE SURG ENC MOIS LTX SZ7 (GLOVE) ×9 IMPLANT
GLOVE SURG SYN 8.0 (GLOVE) ×12 IMPLANT
GLOVE SURG UNDER LTX SZ7.5 (GLOVE) ×12 IMPLANT
GOWN STRL REUS W/ TWL LRG LVL3 (GOWN DISPOSABLE) ×6 IMPLANT
GOWN STRL REUS W/ TWL XL LVL3 (GOWN DISPOSABLE) ×2 IMPLANT
GOWN STRL REUS W/TWL LRG LVL3 (GOWN DISPOSABLE) ×9
GOWN STRL REUS W/TWL XL LVL3 (GOWN DISPOSABLE) ×3
GRASPER SUT TROCAR 14GX15 (MISCELLANEOUS) ×3 IMPLANT
HEMOSTAT ARISTA ABSORB 3G PWDR (HEMOSTASIS) IMPLANT
IRRIGATION STRYKERFLOW (MISCELLANEOUS) IMPLANT
IRRIGATOR STRYKERFLOW (MISCELLANEOUS)
IV NS 1000ML (IV SOLUTION) ×3
IV NS 1000ML BAXH (IV SOLUTION) ×2 IMPLANT
KIT PINK PAD W/HEAD ARE REST (MISCELLANEOUS) ×3
KIT PINK PAD W/HEAD ARM REST (MISCELLANEOUS) ×2 IMPLANT
KIT TURNOVER CYSTO (KITS) ×3 IMPLANT
L-HOOK LAP DISP 36CM (ELECTROSURGICAL)
LABEL OR SOLS (LABEL) ×3 IMPLANT
LHOOK LAP DISP 36CM (ELECTROSURGICAL) IMPLANT
LIGASURE VESSEL 5MM BLUNT TIP (ELECTROSURGICAL) ×3 IMPLANT
MANIFOLD NEPTUNE II (INSTRUMENTS) ×3 IMPLANT
MANIPULATOR VCARE LG CRV RETR (MISCELLANEOUS) IMPLANT
MANIPULATOR VCARE SML CRV RETR (MISCELLANEOUS) IMPLANT
MANIPULATOR VCARE STD CRV RETR (MISCELLANEOUS) ×3 IMPLANT
NS IRRIG 500ML POUR BTL (IV SOLUTION) ×3 IMPLANT
OCCLUDER COLPOPNEUMO (BALLOONS) ×3 IMPLANT
PACK GYN LAPAROSCOPIC (MISCELLANEOUS) ×3 IMPLANT
PAD OB MATERNITY 4.3X12.25 (PERSONAL CARE ITEMS) ×3 IMPLANT
PAD PREP 24X41 OB/GYN DISP (PERSONAL CARE ITEMS) ×3 IMPLANT
PENCIL ELECTRO HAND CTR (MISCELLANEOUS) IMPLANT
POUCH SPECIMEN RETRIEVAL 10MM (ENDOMECHANICALS) IMPLANT
SCISSORS METZENBAUM CVD 33 (INSTRUMENTS) ×3 IMPLANT
SCRUB CHG 4% DYNA-HEX 4OZ (MISCELLANEOUS) ×2 IMPLANT
SET CYSTO W/LG BORE CLAMP LF (SET/KITS/TRAYS/PACK) IMPLANT
SET TUBE SMOKE EVAC HIGH FLOW (TUBING) ×3 IMPLANT
SLEEVE ENDOPATH XCEL 5M (ENDOMECHANICALS) ×3 IMPLANT
SOL PREP PROV IODINE SCRUB 4OZ (MISCELLANEOUS) ×3 IMPLANT
SPONGE GAUZE 2X2 8PLY STRL LF (GAUZE/BANDAGES/DRESSINGS) ×9 IMPLANT
STRIP CLOSURE SKIN 1/4X4 (GAUZE/BANDAGES/DRESSINGS) IMPLANT
SUT ENDO VLOC 180-0-8IN (SUTURE) ×3 IMPLANT
SUT MNCRL 4-0 (SUTURE) ×3
SUT MNCRL 4-0 27XMFL (SUTURE) ×2
SUT MNCRL AB 4-0 PS2 18 (SUTURE) IMPLANT
SUT VIC AB 0 CT1 36 (SUTURE) ×3 IMPLANT
SUT VIC AB 2-0 UR6 27 (SUTURE) IMPLANT
SUT VIC AB 4-0 SH 27 (SUTURE) ×3
SUT VIC AB 4-0 SH 27XANBCTRL (SUTURE) ×2 IMPLANT
SUTURE MNCRL 4-0 27XMF (SUTURE) ×2 IMPLANT
SYR 10ML LL (SYRINGE) ×3 IMPLANT
SYR 50ML LL SCALE MARK (SYRINGE) ×3 IMPLANT
SYR 5ML LL (SYRINGE) IMPLANT
TROCAR ENDO BLADELESS 11MM (ENDOMECHANICALS) ×3 IMPLANT
TROCAR XCEL NON-BLD 5MMX100MML (ENDOMECHANICALS) ×3 IMPLANT
TUBING ART PRESS 48 MALE/FEM (TUBING) IMPLANT
TUBING EVAC SMOKE HEATED PNEUM (TUBING) ×3 IMPLANT

## 2021-02-28 NOTE — Interval H&P Note (Signed)
History and Physical Interval Note:  02/28/2021 9:32 AM  Emily Blake  has presented today for surgery, with the diagnosis of pelvic pain, hydrosalpingx.  The various methods of treatment have been discussed with the patient and family. After consideration of risks, benefits and other options for treatment, the patient has consented to  Procedure(s): TOTAL LAPAROSCOPIC HYSTERECTOMY WITH BILATERAL SALPINGECTOMY (Bilateral) LAPAROSCOPIC OVARIAN CYSTECTOMY (Left) as a surgical intervention.  The patient's history has been reviewed, patient examined, no change in status, stable for surgery.  I have reviewed the patient's chart and labs.  Questions were answered to the patient's satisfaction.     Benjaman Kindler

## 2021-02-28 NOTE — Discharge Instructions (Addendum)
Discharge instructions after   total laparoscopic hysterectomy   For the next three days, take ibuprofen and acetaminophen on a schedule, every 8 hours. You can take them together or you can intersperse them, and take one every four hours. I also gave you gabapentin for nighttime, to help you sleep and also to control pain. Take gabapentin medicines at night for at least the next 3 nights. You also have a narcotic, oxycodone, to take as needed if the above medicines don't help.  Postop constipation is a major cause of pain. Stay well hydrated, walk as you tolerate, and take over the counter senna as well as stool softeners if you need them.    Signs and Symptoms to Report Call our office at [phone] if you have any of the following.   Fever over 100.4 degrees or higher  Severe stomach pain not relieved with pain medications  Bright red bleeding that's heavier than a period that does not slow with rest  To go the bathroom a lot (frequency), you can't hold your urine (urgency), or it hurts when you empty your bladder (urinate)  Chest pain  Shortness of breath  Pain in the calves of your legs  Severe nausea and vomiting not relieved with anti-nausea medications  Signs of infection around your wounds, such as redness, hot to touch, swelling, green/yellow drainage (like pus), bad smelling discharge  Any concerns  What You Can Expect after Surgery  You may see some pink tinged, bloody fluid and bruising around the wound. This is normal.  You may notice shoulder and neck pain. This is caused by the gas used during surgery to expand your abdomen so your surgeon could get to the uterus easier.  You may have a sore throat because of the tube in your mouth during general anesthesia. This will go away in 2 to 3 days.  You may have some stomach cramps.  You may notice spotting on your panties.  You may have pain around the incision sites.   Activities after Your Discharge Follow these  guidelines to help speed your recovery at home:  Do the coughing and deep breathing as you did in the hospital for 2 weeks. Use the small blue breathing device, called the incentive spirometer for 2 weeks.  Don't drive if you are in pain or taking narcotic pain medicine. You may drive when you can safely slam on the brakes, turn the wheel forcefully, and rotate your torso comfortably. This is typically 1-2 weeks. Practice in a parking lot or side street prior to attempting to drive regularly.   Ask others to help with household chores for 4 weeks.  Do not lift anything heavier that 10 pounds for 4-6 weeks. This includes pets, children, and groceries.  Don't do strenuous activities, exercises, or sports like vacuuming, tennis, squash, etc. until your doctor says it is safe to do so. ---Maintain pelvic rest for 8 weeks. This means nothing in the vagina or rectum at all (no douching, tampons, intercourse) for 8 weeks.   Walk as you feel able. Rest often since it may take two or three weeks for your energy level to return to normal.   You may climb stairs  Avoid constipation:   -Eat fruits, vegetables, and whole grains. Eat small meals as your appetite will take time to return to normal.   -Drink 6 to 8 glasses of water each day unless your doctor has told you to limit your fluids.   -Use a laxative   or stool softener as needed if constipation becomes a problem. You may take Miralax, metamucil, Citrucil, Colace, Senekot, FiberCon, etc. If this does not relieve the constipation, try two tablespoons of Milk Of Magnesia every 8 hours until your bowels move.   You may shower. Gently wash the wounds with a mild soap and water. Pat dry.  Do not get in a hot tub, swimming pool, etc. for 6 weeks.  Do not use lotions, oils, powders on the wounds.  Do not douche, use tampons, or have sex until your doctor says it is okay.  Take your pain medicine when you need it. The medicine may not work as well if the pain is  bad.  Take the medicines you were taking before surgery. Other medications you will need are pain medications and possibly constipation and nausea medications (Zofran).  AMBULATORY SURGERY  DISCHARGE INSTRUCTIONS   The drugs that you were given will stay in your system until tomorrow so for the next 24 hours you should not:  Drive an automobile Make any legal decisions Drink any alcoholic beverage   You may resume regular meals tomorrow.  Today it is better to start with liquids and gradually work up to solid foods.  You may eat anything you prefer, but it is better to start with liquids, then soup and crackers, and gradually work up to solid foods.   Please notify your doctor immediately if you have any unusual bleeding, trouble breathing, redness and pain at the surgery site, drainage, fever, or pain not relieved by medication.    Additional Instructions: May remove tegaderm & gauze dressing in a few days, then apply Silvadene ointment daily.        Please contact your physician with any problems or Same Day Surgery at [phone], Monday through Friday 6 am to 4 pm, or [place] at [facility] number at [phone].

## 2021-02-28 NOTE — Op Note (Signed)
Emily Blake PROCEDURE DATE: 02/28/2021  PREOPERATIVE DIAGNOSIS: Chronic pelvic pain, suspected hydrosalpinx POSTOPERATIVE DIAGNOSIS: The same PROCEDURE:  TOTAL LAPAROSCOPIC HYSTERECTOMY WITH BILATERAL SALPINGECTOMY:  Lysis of adhesions  SURGEON:  Dr. Benjaman Kindler, MD ASSISTANT: Dr. Laverta Baltimore, MD  Anesthesiologist:  Anesthesiologist: Velna Hatchet, MD CRNA: Demetrius Charity, CRNA; Fletcher-Harrison, Administrator, arts, CRNA  INDICATIONS: 45 y.o. F here for definitive surgical management secondary to the indications listed under preoperative diagnoses; please see preoperative note for further details.  Risks of surgery were discussed with the patient including but not limited to: bleeding which may require transfusion or reoperation; infection which may require antibiotics; injury to bowel, bladder, ureters or other surrounding organs; need for additional procedures; thromboembolic phenomenon, incisional problems and other postoperative/anesthesia complications. Written informed consent was obtained.    FINDINGS: Small normal uterus, right tube and right ovary.  Left tube significantly edematous, and with significant adhesions to the left ovary and left pelvic sidewall, and minimal filmy adhesions to the left sigmoid.  Normal upper abdomen except for small banjo strings between the liver and anterior abdominal wall.   ANESTHESIA:    General INTRAVENOUS FLUIDS:1000  ml ESTIMATED BLOOD LOSS:50 ml URINE OUTPUT: 900 ml   SPECIMENS: Uterus, cervix, bilateral fallopian tubes  COMPLICATIONS: None immediate  PROCEDURE IN DETAIL:  The patient received prophalactic intravenous antibiotics and had sequential compression devices applied to her lower extremities while in the preoperative area.  She was then taken to the operating room where general anesthesia was administered and was found to be adequate.  She was placed in the dorsal lithotomy position, and was prepped and draped in a sterile  manner.  A formal time out was performed with all team members present and in agreement.  A V-care uterine manipulator was placed at this time.  A Foley catheter was inserted into her bladder and attached to constant drainage. Attention was turned to the abdomen and 0.5% Marcaine infused subq. A 59mm umbilical incision was made with the scalpel.  The Optiview 5-mm trocar and sleeve were then advanced without difficulty with the laparoscope under direct visualization into the abdomen.  The abdomen was then insufflated with carbon dioxide gas and adequate pneumoperitoneum was obtained.  A survey of the patient's pelvis and abdomen revealed the findings above.  Bilateral lower quadrant ports (5 mm on the right and 11 mm on the left) were then placed under direct visualization.  The pelvis was then carefully examined.  Attention was turned to the fallopian tubes; these were freed from the underlying mesosalpinx and the uterine attachments using the Ligasure device.  The bilateral round and broad ligaments were then clamped and transected with the Ligasure device.  The uterine artery was then skeletonized and a bladder flap was created.  The ureters were noted to be safely away from the area of dissection.  The bladder was then bluntly dissected off the lower uterine segment.    At this point, attention was turned to the uterine vessels, which were clamped and cauterized using the Ligasure on the left, and then the right. After the uterine blood flow at the level of the internal os was controlled, both arteries were cut with the Ligasure.  Good hemostasis was noted overall.  The uterosacral and cardinal ligaments were clamped, cut and ligated bilaterally .  Attention was then turned to the cervicovaginal junction, and monopolar scissors were used to transect the cervix from the surrounding vagina using the ring of the V-care as a guide. This was done  circumferentially allowing total hysterectomy.  The uterus was then  removed from the vagina and the vaginal cuff incision was then closed with running V-loc suture.  Overall excellent hemostasis was noted.    Attention was returned to the abdomen.The ureters were reexamined bilaterally and were pulsating normally. The abdominal pressure was reduced and hemostasis was confirmed.   Intravenous floruoceine was administered, and cystoscopy showed bilateral ureteral jets.  No stitches were visualized in the bladder during cystoscopy.  The 61mm port fascia was closed with a vertical mattress with 0-Vicryl, using the cone closure system. All trocars were removed under direct visualization, and the abdomen was desufflated.  All skin incisions were closed with 4-0 Vicryl subcuticular stitches and Dermabond. The RLQ incision was bleeding that did not resolve with pressure, and cautery was used to obtain hemostasis. Silvadene ointment placed at this site.  The patient tolerated the procedures well.  All instruments, needles, and sponge counts were correct x 2. The patient was taken to the recovery room awake, extubated and in stable condition.

## 2021-02-28 NOTE — Transfer of Care (Signed)
Immediate Anesthesia Transfer of Care Note  Patient: Emily Blake  Procedure(s) Performed: TOTAL LAPAROSCOPIC HYSTERECTOMY WITH BILATERAL SALPINGECTOMY (Bilateral)  Patient Location: PACU  Anesthesia Type:General  Level of Consciousness: drowsy  Airway & Oxygen Therapy: Patient Spontanous Breathing and Patient connected to nasal cannula oxygen  Post-op Assessment: Report given to RN and Post -op Vital signs reviewed and stable  Post vital signs: Reviewed and stable  Last Vitals:  Vitals Value Taken Time  BP 132/70 02/28/21 1150  Temp    Pulse 93 02/28/21 1154  Resp 17 02/28/21 1154  SpO2 100 % 02/28/21 1154  Vitals shown include unvalidated device data.  Last Pain:  Vitals:   02/28/21 0805  TempSrc: Temporal  PainSc: 4          Complications: No notable events documented.

## 2021-02-28 NOTE — Anesthesia Procedure Notes (Addendum)
Procedure Name: Intubation Date/Time: 02/28/2021 9:50 AM Performed by: Kelton Pillar, CRNA Pre-anesthesia Checklist: Patient identified, Emergency Drugs available, Suction available and Patient being monitored Patient Re-evaluated:Patient Re-evaluated prior to induction Oxygen Delivery Method: Circle system utilized Preoxygenation: Pre-oxygenation with 100% oxygen Induction Type: IV induction Ventilation: Mask ventilation without difficulty Laryngoscope Size: McGraph and 3 Grade View: Grade I Tube type: Oral Tube size: 6.5 mm Number of attempts: 1 Airway Equipment and Method: Stylet and Oral airway Placement Confirmation: ETT inserted through vocal cords under direct vision, positive ETCO2, breath sounds checked- equal and bilateral and CO2 detector Secured at: 21 cm Tube secured with: Tape Dental Injury: Teeth and Oropharynx as per pre-operative assessment

## 2021-02-28 NOTE — Anesthesia Preprocedure Evaluation (Addendum)
Anesthesia Evaluation  Patient identified by MRN, date of birth, ID band Patient awake    Reviewed: Allergy & Precautions, NPO status , Patient's Chart, lab work & pertinent test results, reviewed documented beta blocker date and time   History of Anesthesia Complications Negative for: history of anesthetic complications (no family hx)  Airway Mallampati: I  TM Distance: >3 FB Neck ROM: Full    Dental   Pulmonary neg pulmonary ROS,    Pulmonary exam normal        Cardiovascular negative cardio ROS Normal cardiovascular exam     Neuro/Psych Anxiety negative neurological ROS     GI/Hepatic negative GI ROS, Neg liver ROS,   Endo/Other  BMI 32  Renal/GU negative Renal ROS   Hydrosalpinx, Pelvic Pain    Musculoskeletal negative musculoskeletal ROS (+)   Abdominal   Peds  Hematology negative hematology ROS (+)   Anesthesia Other Findings   Reproductive/Obstetrics                            Anesthesia Physical Anesthesia Plan  ASA: 2  Anesthesia Plan: General   Post-op Pain Management:    Induction: Intravenous  PONV Risk Score and Plan: 4 or greater and Scopolamine patch - Pre-op, Dexamethasone and Ondansetron  Airway Management Planned: Oral ETT  Additional Equipment:   Intra-op Plan:   Post-operative Plan: Extubation in OR  Informed Consent: I have reviewed the patients History and Physical, chart, labs and discussed the procedure including the risks, benefits and alternatives for the proposed anesthesia with the patient or authorized representative who has indicated his/her understanding and acceptance.       Plan Discussed with: CRNA  Anesthesia Plan Comments: (Pre-op scopolamine ordered  Has a 22g PIV - will place larger one after induction)      Anesthesia Quick Evaluation

## 2021-03-01 ENCOUNTER — Encounter: Payer: 59 | Admitting: Hospice and Palliative Medicine

## 2021-03-01 ENCOUNTER — Encounter: Payer: Self-pay | Admitting: Obstetrics and Gynecology

## 2021-03-02 LAB — SURGICAL PATHOLOGY

## 2021-03-09 NOTE — Anesthesia Postprocedure Evaluation (Signed)
Anesthesia Post Note  Patient: Emily Blake  Procedure(s) Performed: TOTAL LAPAROSCOPIC HYSTERECTOMY WITH BILATERAL SALPINGECTOMY (Bilateral)  Patient location during evaluation: PACU Anesthesia Type: General Level of consciousness: awake and alert Pain management: pain level controlled Vital Signs Assessment: post-procedure vital signs reviewed and stable Respiratory status: spontaneous breathing, nonlabored ventilation, respiratory function stable and patient connected to nasal cannula oxygen Cardiovascular status: blood pressure returned to baseline and stable Postop Assessment: no apparent nausea or vomiting Anesthetic complications: no   No notable events documented.   Last Vitals:  Vitals:   02/28/21 1500 02/28/21 1530  BP:  102/66  Pulse: 78 82  Resp: 18 18  Temp: 36.4 C   SpO2:  98%    Last Pain:  Vitals:   03/01/21 0841  TempSrc:   PainSc: 3                  Lonza Shimabukuro M Silvanna Ohmer  Late entry

## 2021-06-13 ENCOUNTER — Encounter: Payer: 59 | Admitting: Internal Medicine

## 2021-09-28 ENCOUNTER — Telehealth: Payer: Self-pay | Admitting: Nurse Practitioner

## 2021-09-28 NOTE — Telephone Encounter (Signed)
Called patient and appt set

## 2021-09-28 NOTE — Telephone Encounter (Signed)
Patient called in to [facility] pt appt with /dr Reece Packer, Please call back

## 2021-09-29 ENCOUNTER — Ambulatory Visit: Payer: 59 | Admitting: Physician Assistant

## 2021-10-31 ENCOUNTER — Encounter: Payer: 59 | Admitting: Physician Assistant

## 2021-11-24 ENCOUNTER — Ambulatory Visit: Payer: Self-pay | Admitting: Nurse Practitioner

## 2022-05-03 ENCOUNTER — Emergency Department: Payer: 59

## 2022-05-03 ENCOUNTER — Other Ambulatory Visit: Payer: Self-pay

## 2022-05-03 ENCOUNTER — Emergency Department
Admission: EM | Admit: 2022-05-03 | Discharge: 2022-05-03 | Disposition: A | Discharge status: Home / Self Care | Payer: 59 | Attending: Emergency Medicine | Admitting: Emergency Medicine

## 2022-05-03 DIAGNOSIS — U071 COVID-19: Secondary | ICD-10-CM | POA: Diagnosis not present

## 2022-05-03 DIAGNOSIS — R079 Chest pain, unspecified: Secondary | ICD-10-CM

## 2022-05-03 DIAGNOSIS — J069 Acute upper respiratory infection, unspecified: Secondary | ICD-10-CM | POA: Diagnosis not present

## 2022-05-03 DIAGNOSIS — R059 Cough, unspecified: Secondary | ICD-10-CM | POA: Diagnosis present

## 2022-05-03 DIAGNOSIS — R1110 Vomiting, unspecified: Secondary | ICD-10-CM | POA: Diagnosis not present

## 2022-05-03 LAB — BASIC METABOLIC PANEL
Anion gap: 9 (ref 5–15)
BUN: 12 mg/dL (ref 6–20)
CO2: 23 mmol/L (ref 22–32)
Calcium: 9 mg/dL (ref 8.9–10.3)
Chloride: 105 mmol/L (ref 98–111)
Creatinine, Ser: 0.86 mg/dL (ref 0.44–1.00)
GFR, Estimated: >60 mL/min (ref >60)
Glucose, Bld: 102 mg/dL — ABNORMAL HIGH (ref 70–99)
Potassium: 3.2 mmol/L — ABNORMAL LOW (ref 3.5–5.1)
Sodium: 137 mmol/L (ref 135–145)

## 2022-05-03 LAB — CBC
HCT: 40.8 % (ref 36.0–46.0)
Hemoglobin: 13.8 g/dL (ref 12.0–15.0)
MCH: 30.1 pg (ref 26.0–34.0)
MCHC: 33.8 g/dL (ref 30.0–36.0)
MCV: 89.1 fL (ref 80.0–100.0)
Platelets: 209 10*3/uL (ref 150–400)
RBC: 4.58 MIL/uL (ref 3.87–5.11)
RDW: 11.7 % (ref 11.5–15.5)
WBC: 6.1 10*3/uL (ref 4.0–10.5)
nRBC: 0 % (ref 0.0–0.2)

## 2022-05-03 LAB — RESP PANEL BY RT-PCR (FLU A&B, COVID) ARPGX2
Influenza A by PCR: NEGATIVE
Influenza B by PCR: NEGATIVE
SARS Coronavirus 2 by RT PCR: POSITIVE — AB

## 2022-05-03 LAB — POC URINE PREG, ED: Preg Test, Ur: NEGATIVE

## 2022-05-03 LAB — TROPONIN I (HIGH SENSITIVITY)
Troponin I (High Sensitivity): 2 ng/L (ref <18)
Troponin I (High Sensitivity): <2 ng/L (ref <18)

## 2022-05-03 MED ORDER — PREDNISONE 10 MG (21) PO TBPK: ORAL_TABLET | ORAL | 0 refills | Status: AC

## 2022-05-03 MED ORDER — ALBUTEROL SULFATE HFA 108 (90 BASE) MCG/ACT IN AERS: 2.0000 | INHALATION_SPRAY | Freq: Four times a day (QID) | RESPIRATORY_TRACT | 2 refills | Status: AC | PRN

## 2022-05-03 NOTE — ED Triage Notes (Signed)
Pt to ED see first RN note, [facility] from [facility]. Pt began with chest pain, cough, vomiting today around 2pm.   Constant dry cough noted. Pt states feels "sweaty", has been feeling sick with flulike symptoms since Friday (5d). 22# to L AC, 324 mg aspirin already received.

## 2022-05-03 NOTE — ED Triage Notes (Signed)
First Nurse Note:  ARrives via [facility] from [facility].  Patient has had dry cough ongoing.  Wheezing heard to upper lobes, albuterol given by EMS.  Patient has been feeling bad since Friday.  Patient c/o chest pain with cough.  VS wnl.  22 LAC.  4 mg Zofran given. 4 baby ASA given at [facility].

## 2022-05-03 NOTE — ED Provider Notes (Signed)
[facility] Provider Note   Event Date/Time   First MD Initiated Contact with Patient 05/03/22 1959     (approximate) History  Chest Pain, Cough, and Vomiting  HPI Emily Blake is a 46 y.o. female with stated past medical history of anxiety who presents complaining of cough, chest pain, and vomiting.  Patient states that she has had a chest pain and cough over the last 5 days as well as intermittent vomiting over this time as well.  Patient states that she has also felt significant fatigue over this time.  Patient with subjective fevers as well. ROS: Patient currently denies any vision changes, tinnitus, difficulty speaking, facial droop, sore throat, chest pain, shortness of breath, diarrhea, dysuria, or weakness/numbness/paresthesias in any extremity   Physical Exam  Triage Vital Signs: ED Triage Vitals  Enc Vitals Group     BP 05/03/22 1747 (!) 133/110     Pulse Rate 05/03/22 1747 (!) 111     Resp 05/03/22 1747 16     Temp 05/03/22 1747 97.8 F (36.6 C)     Temp Source 05/03/22 1747 Oral     SpO2 05/03/22 1747 100 %     Weight 05/03/22 1744 178 lb (80.7 kg)     Height 05/03/22 1744 '5\' 2"'$  (1.575 m)     Head Circumference --      Peak Flow --      Pain Score 05/03/22 1743 7     Pain Loc --      Pain Edu? --      Excl. in [place]? --    Most recent vital signs: Vitals:   05/03/22 1747  BP: (!) 133/110  Pulse: (!) 111  Resp: 16  Temp: 97.8 F (36.6 C)  SpO2: 100%   General: Awake, oriented x4. CV:  Good peripheral perfusion.  Resp:  Normal effort.  Clear to auscultation bilaterally Abd:  No distention.  Other:  Middle-aged overweight Caucasian female laying in bed in no acute distress ED Results / Procedures / Treatments  Labs (all labs ordered are listed, but only abnormal results are displayed) Labs Reviewed  BASIC METABOLIC PANEL - Abnormal; Notable for the following components:      Result Value   Potassium 3.2 (*)    Glucose, Bld 102  (*)    All other components within normal limits  POC URINE PREG, ED - Normal  RESP PANEL BY RT-PCR (FLU A&B, COVID) ARPGX2  CBC  TROPONIN I (HIGH SENSITIVITY)  TROPONIN I (HIGH SENSITIVITY)   EKG ED ECG REPORT I, Naaman Plummer, the attending physician, personally viewed and interpreted this ECG. Date: 05/03/2022 EKG Time: 1743 Rate: 123 Rhythm: Tachycardic sinus rhythm QRS Axis: normal Intervals: normal ST/T Wave abnormalities: normal Narrative Interpretation: Tachycardic sinus rhythm.  No evidence of acute ischemia RADIOLOGY ED MD interpretation: 2 view chest x-ray interpreted by me shows no evidence of acute abnormalities including no pneumonia, pneumothorax, or widened mediastinum -Agree with radiology assessment Official radiology report(s): DG Chest 2 View  Result Date: 05/03/2022 CLINICAL DATA:  Chest pain.  Cough. EXAM: CHEST - 2 VIEW COMPARISON:  None Available. FINDINGS: The cardiomediastinal contours are normal. The lungs are clear. Pulmonary vasculature is normal. No consolidation, pleural effusion, or pneumothorax. No acute osseous abnormalities are seen. IMPRESSION: Negative radiographs of the chest. Electronically Signed   By: Keith Rake M.D.   On: 05/03/2022 18:17   PROCEDURES: Critical Care performed: No Procedures MEDICATIONS ORDERED IN ED: Medications - No  data to display IMPRESSION / MDM / [place] / ED COURSE  I reviewed the triage vital signs and the nursing notes.                             The patient is on the cardiac monitor to evaluate for evidence of arrhythmia and/or significant heart rate changes. Patient's presentation is most consistent with acute presentation with potential threat to life or bodily function. Presentation most consistent with Viral Syndrome.  Patient has tested positive for COVID-19. Based on vitals and exam they are nontoxic and stable for discharge.  Given History and Exam I have a lower suspicion  for: Emergent CardioPulmonary causes [such as Acute Asthma or COPD Exacerbation, acute Heart Failure or exacerbation, PE, PTX, atypical ACS, PNA]. Emergent Otolaryngeal causes [such as PTA, RPA, Ludwigs, Epiglottitis, EBV].  Regarding Emergent Travel or Immunosuppressive related infectious: I have a low suspicion for acute HIV.  Will provide strict return precautions and instructions on self-isolation/quarantine and anticipatory guidance.   FINAL CLINICAL IMPRESSION(S) / ED DIAGNOSES   Final diagnoses:  Chest pain, unspecified type  Viral URI with cough   Rx / DC Orders   ED Discharge Orders          Ordered    predniSONE (STERAPRED UNI-PAK 21 TAB) 10 MG (21) TBPK tablet        05/03/22 2121    albuterol (VENTOLIN HFA) 108 (90 Base) MCG/ACT inhaler  Every 6 hours PRN        05/03/22 2121           Note:  This document was prepared using Dragon voice recognition software and may include unintentional dictation errors.   Naaman Plummer, MD 05/03/22 [phone]

## 2022-09-06 ENCOUNTER — Encounter: Payer: Self-pay | Admitting: Emergency Medicine

## 2022-09-06 ENCOUNTER — Ambulatory Visit
Admission: EM | Admit: 2022-09-06 | Discharge: 2022-09-06 | Disposition: A | Discharge status: Home / Self Care | Payer: 59 | Attending: Family Medicine | Admitting: Family Medicine

## 2022-09-06 DIAGNOSIS — E860 Dehydration: Secondary | ICD-10-CM | POA: Diagnosis not present

## 2022-09-06 DIAGNOSIS — R197 Diarrhea, unspecified: Secondary | ICD-10-CM | POA: Insufficient documentation

## 2022-09-06 DIAGNOSIS — R1084 Generalized abdominal pain: Secondary | ICD-10-CM | POA: Diagnosis not present

## 2022-09-06 DIAGNOSIS — R112 Nausea with vomiting, unspecified: Secondary | ICD-10-CM | POA: Diagnosis not present

## 2022-09-06 LAB — COMPREHENSIVE METABOLIC PANEL
ALT: 21 U/L (ref 0–44)
AST: 25 U/L (ref 15–41)
Albumin: 4.2 g/dL (ref 3.5–5.0)
Alkaline Phosphatase: 82 U/L (ref 38–126)
Anion gap: 10 (ref 5–15)
BUN: 14 mg/dL (ref 6–20)
CO2: 27 mmol/L (ref 22–32)
Calcium: 8.6 mg/dL — ABNORMAL LOW (ref 8.9–10.3)
Chloride: 100 mmol/L (ref 98–111)
Creatinine, Ser: 0.83 mg/dL (ref 0.44–1.00)
GFR, Estimated: >60 mL/min (ref >60)
Glucose, Bld: 106 mg/dL — ABNORMAL HIGH (ref 70–99)
Potassium: 3.5 mmol/L (ref 3.5–5.1)
Sodium: 137 mmol/L (ref 135–145)
Total Bilirubin: 0.5 mg/dL (ref 0.3–1.2)
Total Protein: 8 g/dL (ref 6.5–8.1)

## 2022-09-06 LAB — CBC WITH DIFFERENTIAL/PLATELET
Abs Immature Granulocytes: 0.01 10*3/uL (ref 0.00–0.07)
Basophils Absolute: 0 10*3/uL (ref 0.0–0.1)
Basophils Relative: 0 %
Eosinophils Absolute: 0.1 10*3/uL (ref 0.0–0.5)
Eosinophils Relative: 2 %
HCT: 43.1 % (ref 36.0–46.0)
Hemoglobin: 15.3 g/dL — ABNORMAL HIGH (ref 12.0–15.0)
Immature Granulocytes: 0 %
Lymphocytes Relative: 33 %
Lymphs Abs: 2.2 10*3/uL (ref 0.7–4.0)
MCH: 30.5 pg (ref 26.0–34.0)
MCHC: 35.5 g/dL (ref 30.0–36.0)
MCV: 86 fL (ref 80.0–100.0)
Monocytes Absolute: 0.8 10*3/uL (ref 0.1–1.0)
Monocytes Relative: 11 %
Neutro Abs: 3.7 10*3/uL (ref 1.7–7.7)
Neutrophils Relative %: 54 %
Platelets: 259 10*3/uL (ref 150–400)
RBC: 5.01 MIL/uL (ref 3.87–5.11)
RDW: 11.4 % — ABNORMAL LOW (ref 11.5–15.5)
WBC: 6.8 10*3/uL (ref 4.0–10.5)
nRBC: 0 % (ref 0.0–0.2)

## 2022-09-06 LAB — LIPASE, BLOOD: Lipase: 34 U/L (ref 11–51)

## 2022-09-06 MED ORDER — METOCLOPRAMIDE HCL 5 MG/ML IJ SOLN
10.0000 mg | Freq: Once | INTRAMUSCULAR | Status: AC
  Administered 2022-09-06: 10 mg via INTRAMUSCULAR

## 2022-09-06 MED ORDER — PROMETHAZINE HCL 25 MG PO TABS: 25.0000 mg | ORAL_TABLET | Freq: Four times a day (QID) | ORAL | 0 refills | Status: AC | PRN

## 2022-09-06 NOTE — ED Provider Notes (Signed)
[facility]    CSN: [id number] Arrival date & time: 09/06/22  1728      History   Chief Complaint Chief Complaint  Patient presents with   Emesis    HPI Emily Blake is a 47 y.o. female.   HPI   Emily Blake presents for vomiting, diarrhea since Sunday night. 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  She has been gassy and burping. Her husband didn't eat the same food. She has not had reprieve from vomiting and diarrhea with home Zofran. On Monday, she had fever and chills. She used a heated blanket to sweat it out.  Tmax 100.2 F.  She is an Scientist, physiological.  Had a total hysterectomy in the past.    Chills: yes Sore throat: no   Cough: no Sputum: no Nasal congestion : no  Rhinorrhea: no Myalgias: no Appetite: normal  Hydration: normal  Abdominal pain: no Nausea: yes Vomiting: yes Sleep disturbance: yes Headache: yes      Past Medical History:  Diagnosis Date   Anxiety    Hemorrhoids    Hydrosalpinx    In vitro fertilization 2018    Patient Active Problem List   Diagnosis Date Noted   Hemorrhoids 06/17/2020   Infertility management 03/22/2020   Routine cervical smear 03/22/2020   Encounter for screening mammogram for malignant neoplasm of breast 03/22/2020   Furuncle of female breast 03/28/2019   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   Screening for malignant neoplasm of cervix 02/02/2019   Dysuria 02/02/2019   Elevated blood pressure reading 12/28/2018   Encounter for general adult medical examination with abnormal findings 12/28/2018   Fatigue 09/01/2018   Generalized anxiety disorder with panic attacks 09/01/2018   Anxiety 01/22/2018   Posterior tibial tendon dysfunction (PTTD) of both lower extremities 09/06/2016   Hydrosalpinx 08/23/2016    Past Surgical History:  Procedure Laterality Date   LEEP  2006   TOTAL LAPAROSCOPIC HYSTERECTOMY WITH SALPINGECTOMY Bilateral 02/28/2021   Procedure: TOTAL  LAPAROSCOPIC HYSTERECTOMY WITH BILATERAL SALPINGECTOMY;  Surgeon: Benjaman Kindler, MD;  Location: [facility] ORS;  Service: Gynecology;  Laterality: Bilateral;   TUBAL LIGATION  12/2016   hysteroscopy, lysis of adhesions, tubal ligation    OB History   No obstetric history on file.      Home Medications    Prior to Admission medications   Medication Sig Start Date End Date Taking? Authorizing Provider  promethazine (PHENERGAN) 25 MG tablet Take 1 tablet (25 mg total) by mouth every 6 (six) hours as needed for nausea or vomiting. 09/06/22  Yes Szymon Foiles, DO  albuterol (VENTOLIN HFA) 108 (90 Base) MCG/ACT inhaler Inhale 2 puffs into the lungs every 6 (six) hours as needed for wheezing or shortness of breath. 05/03/22   Naaman Plummer, MD  ALPRAZolam Duanne Moron) 0.5 MG tablet TAKE 1 TABLET BY MOUTH TWICE A DAY AS NEEDED FOR ANXIETY 03/03/20   Ronnell Freshwater, NP  docusate sodium (COLACE) 100 MG capsule Take 1 capsule (100 mg total) by mouth 2 (two) times daily. To keep stools soft 02/28/21   Benjaman Kindler, MD  gabapentin (NEURONTIN) 800 MG tablet Take 1 tablet (800 mg total) by mouth at bedtime for 14 days. Take nightly for 3 days, then up to 14 days as needed 02/28/21 03/14/21  Benjaman Kindler, MD  LO LOESTRIN FE 1 MG-10 MCG / 10 MCG tablet Take 1 tablet by mouth daily. 02/09/21   [provider]  oxyCODONE (OXY IR/ROXICODONE)  5 MG immediate release tablet Take 1 tablet (5 mg total) by mouth every 4 (four) hours as needed for severe pain. 02/28/21   Benjaman Kindler, MD  predniSONE (STERAPRED UNI-PAK 21 TAB) 10 MG (21) TBPK tablet As directed on packaging 05/03/22   Naaman Plummer, MD    Family History Family History  Problem Relation Age of Onset   Diabetes Mother    Hypertension Mother    Hypertension Father    Hypothyroidism Father    Breast cancer Neg Hx     Social History Social History   Tobacco Use   Smoking status: Never   Smokeless tobacco: Never  Vaping Use    Vaping Use: Never used  Substance Use Topics   Alcohol use: Not Currently   Drug use: Never     Allergies   Amoxicillin   Review of Systems Review of Systems: negative unless otherwise stated in HPI.      Physical Exam Triage Vital Signs ED Triage Vitals  Enc Vitals Group     BP 09/06/22 1837 109/76     Pulse Rate 09/06/22 1837 85     Resp 09/06/22 1837 16     Temp 09/06/22 1837 97.9 F (36.6 C)     Temp Source 09/06/22 1837 Oral     SpO2 09/06/22 1837 95 %     Weight --      Height --      Head Circumference --      Peak Flow --      Pain Score 09/06/22 1834 3     Pain Loc --      Pain Edu? --      Excl. in [place]? --    No data found.  Updated Vital Signs BP 109/76 (BP Location: Left Arm)   Pulse 85   Temp 97.9 F (36.6 C) (Oral)   Resp 16   LMP 01/29/2021 (Exact Date)   SpO2 95%   Visual Acuity Right Eye Distance:   Left Eye Distance:   Bilateral Distance:    Right Eye Near:   Left Eye Near:    Bilateral Near:     Physical Exam GEN:     alert, nontoxic appearing female and no distress    HENT:  mucus membranes moist, oropharyngeal without lesions or erythema,  nares patent, no nasal discharge  EYES:   pupils equal and reactive, no scleral icterus NECK:  supple, normal ROM RESP:  clear to auscultation bilaterally, no increased work of breathing  CVS:   regular rate and rhythm, no murmur, distal pulses intact   ABD:  soft, non-tender in all quadrants; bowel sounds present; no palpable masses,  EXT:   normal ROM NEURO:  normal without focal findings,  speech normal, alert and oriented   Skin:   warm and dry, no pallor or jaundice Psych: Normal affect, appropriate speech and behavior      UC Treatments / Results  Labs (all labs ordered are listed, but only abnormal results are displayed) Labs Reviewed  CBC WITH DIFFERENTIAL/PLATELET - Abnormal; Notable for the following components:      Result Value   Hemoglobin 15.3 (*)    RDW 11.4 (*)     All other components within normal limits  COMPREHENSIVE METABOLIC PANEL - Abnormal; Notable for the following components:   Glucose, Bld 106 (*)    Calcium 8.6 (*)    All other components within normal limits  LIPASE, BLOOD    EKG   Radiology  No results found.  Procedures Procedures (including critical care time)  Medications Ordered in UC Medications  metoCLOPramide (REGLAN) injection 10 mg (10 mg Intramuscular Given 09/06/22 1905)    Initial Impression / Assessment and Plan / UC Course  I have reviewed the triage vital signs and the nursing notes.  Pertinent labs & imaging results that were available during my care of the patient were reviewed by me and considered in my medical decision making (see chart for details).       Patient is a 47 y.o. female  who presents for 4 days of persistent nausea, vomiting and diarrhea.  Overall patient is nontoxic-appearing and afebrile.  Vital signs stable. Has history of tubal ligation and hysterectomy pregnancy.  Obtain CBC, CMP and lipase.  She was given Reglan IM 10 mg.  On chart review, she had a CT abdomen pelvis in August 2019 for right lower quadrant abdominal pain that showed dilated fallopian tubes and a otherwise normal appendix.  She has since had a tubal ligation therefore doubt this is the cause of her symptoms.  Her exam is not concerning for acute abdomen and there is no scleral icterus or jaundice.  She does have evidence of hemoconcentration on CBC.  CBC was grossly unremarkable therefore I doubt acute hepatitis or right upper quadrant pathology at this time.  Her lipase is also normal therefore not likely to be pancreatitis.  She does have history of a fever but is afebrile here.  She has no urinary complaints therefore UA was deferred at this time.  Given that she has nausea vomiting and diarrhea I am going to presume this is most likely viral in nature.  She was sent home with Phenergan 25 mg as needed.   ED and return  precautions given and patient voiced understanding. Discussed MDM, treatment plan and plan for follow-up with patient who agrees with plan.    Final Clinical Impressions(s) / UC Diagnoses   Final diagnoses:  Nausea vomiting and diarrhea  Generalized abdominal pain  Dehydration     Discharge Instructions      Stop by the pharmacy to pick up your nausea medicines.  Your lab work did not show cause of your abdominal pain.  There were no signs of inflammation or infection.  It did show that you are slightly dehydrated.  Be sure to drink plenty of fluids in the next 24 to 48 hours to rehydrate yourself.  See handout on rehydration.     ED Prescriptions     Medication Sig Dispense Auth. Provider   promethazine (PHENERGAN) 25 MG tablet Take 1 tablet (25 mg total) by mouth every 6 (six) hours as needed for nausea or vomiting. 30 tablet Lyndee Hensen, DO      PDMP not reviewed this encounter.   Lyndee Hensen, DO 09/09/22 2330

## 2022-09-06 NOTE — ED Triage Notes (Signed)
Pt c/o vomiting, diarrhea. Started about 4 days ago. Pt states she thinks she is dehydrated. She has also had a fever. She believes she has food poisoning.

## 2022-09-06 NOTE — Discharge Instructions (Addendum)
Stop by the pharmacy to pick up your nausea medicines.  Your lab work did not show cause of your abdominal pain.  There were no signs of inflammation or infection.  It did show that you are slightly dehydrated.  Be sure to drink plenty of fluids in the next 24 to 48 hours to rehydrate yourself.  See handout on rehydration.

## 2023-12-20 ENCOUNTER — Other Ambulatory Visit: Payer: Self-pay | Admitting: Obstetrics and Gynecology

## 2023-12-20 DIAGNOSIS — R200 Anesthesia of skin: Secondary | ICD-10-CM

## 2023-12-21 ENCOUNTER — Encounter: Payer: Self-pay | Admitting: Obstetrics and Gynecology

## 2024-08-08 ENCOUNTER — Ambulatory Visit
Admission: RE | Admit: 2024-08-08 | Discharge: 2024-08-08 | Disposition: A | Discharge status: Home / Self Care | Payer: Self-pay | Source: Ambulatory Visit | Attending: Emergency Medicine | Admitting: Emergency Medicine

## 2024-08-08 VITALS — BP 139/98 | HR 89 | Temp 98.5°F | Wt 184.4 lb

## 2024-08-08 DIAGNOSIS — L0100 Impetigo, unspecified: Secondary | ICD-10-CM | POA: Diagnosis not present

## 2024-08-08 DIAGNOSIS — R051 Acute cough: Secondary | ICD-10-CM | POA: Diagnosis not present

## 2024-08-08 LAB — POC SOFIA SARS ANTIGEN FIA: SARS Coronavirus 2 Ag: NEGATIVE

## 2024-08-08 NOTE — ED Triage Notes (Signed)
 Pt c/o fatigue, nasal blisters, clammy skin, and elevated blood pressure x3days  Pt has been using Manuka honey inside her nose to stop the blister formation and bleeding

## 2024-08-08 NOTE — ED Provider Notes (Signed)
 " [facility]    CSN: [id number] Arrival date & time: 08/08/24  1403      History   Chief Complaint Chief Complaint  Patient presents with   Influenza    HPI Charlisa Lombard is a 48 y.o. female.   HPI  27 old female with no significant past medical history presents for evaluation of fatigue, nasal blisters, and clammy skin that is been going on for last 3 days.  No fever, runny nose, nasal congestion, or cough.  She is concerned about possible COVID or flu.  History reviewed. No pertinent past medical history.  There are no active problems to display for this patient.   History reviewed. No pertinent surgical history.  OB History   No obstetric history on file.      Home Medications    Prior to Admission medications  Medication Sig Start Date End Date Taking? Authorizing Provider  ALPRAZolam (XANAX) 0.5 MG tablet Take 0.5 mg by mouth daily as needed. 08/02/24  Yes [provider]  doxycycline  (VIBRAMYCIN ) 100 MG capsule Take 1 capsule (100 mg total) by mouth 2 (two) times daily for 7 days. 08/08/24 08/15/24 Yes Bernardino Ditch, NP  estradiol (VIVELLE-DOT) 0.1 MG/24HR patch Place 1 patch onto the skin 2 (two) times a week. 08/03/24  Yes [provider]  metFORMIN (GLUCOPHAGE) 500 MG tablet Take by mouth. 05/14/24  Yes [provider]  mupirocin  ointment (BACTROBAN ) 2 % Apply 1 Application topically 2 (two) times daily. 08/08/24  Yes Bernardino Ditch, NP  nortriptyline (PAMELOR) 10 MG capsule Take 10 mg by mouth at bedtime. 08/05/24  Yes [provider]    Family History History reviewed. No pertinent family history.  Social History Social History[1]   Allergies   Amoxicillin   Review of Systems Review of Systems  Constitutional:  Positive for diaphoresis and fatigue. Negative for fever.  HENT:  Negative for congestion and rhinorrhea.        Nasal blisters  Respiratory:  Negative for cough.      Physical Exam Triage  Vital Signs ED Triage Vitals  Encounter Vitals Group     BP      Girls Systolic BP Percentile      Girls Diastolic BP Percentile      Boys Systolic BP Percentile      Boys Diastolic BP Percentile      Pulse      Resp      Temp      Temp src      SpO2      Weight      Height      Head Circumference      Peak Flow      Pain Score      Pain Loc      Pain Education      Exclude from Growth Chart    No data found.  Updated Vital Signs BP (!) 139/98 (BP Location: Left Arm)   Pulse 89   Temp 98.5 F (36.9 C) (Oral)   Wt 184 lb 6.4 oz (83.6 kg)   SpO2 100%   Visual Acuity Right Eye Distance:   Left Eye Distance:   Bilateral Distance:    Right Eye Near:   Left Eye Near:    Bilateral Near:     Physical Exam Vitals and nursing note reviewed.  Constitutional:      Appearance: Normal appearance. She is not ill-appearing.  HENT:     Head: Normocephalic and atraumatic.  Nose: No congestion or rhinorrhea.     Comments: There are multiple blisters on the inside of both nostrils.  The left blisters have bloody drainage.  There is also a single lesion on the exterior rim of the left nostril. Skin:    General: Skin is warm and dry.     Capillary Refill: Capillary refill takes less than 2 seconds.     Findings: No erythema or rash.  Neurological:     General: No focal deficit present.     Mental Status: She is alert and oriented to person, place, and time.      UC Treatments / Results  Labs (all labs ordered are listed, but only abnormal results are displayed) Labs Reviewed  POC SOFIA SARS ANTIGEN FIA    EKG   Radiology No results found.  Procedures Procedures (including critical care time)  Medications Ordered in UC Medications - No data to display  Initial Impression / Assessment and Plan / UC Course  I have reviewed the triage vital signs and the nursing notes.  Pertinent labs & imaging results that were available during my care of the patient were  reviewed by me and considered in my medical decision making (see chart for details).   Patient is a nontoxic-appearing 48 year old female presenting for evaluation of nasal blisters with associated clammy feeling and fatigue.  Also elevated blood pressure.  She denies any history of blisters in her nose and she has been runny nose whose had similar symptoms.  She denies any upper or lower respiratory symptoms as well.  She is concerned about possible influenza or COVID.  I advised her that she has had symptoms for 3 days, and while they are neither like COVID or influenza we would not test her for influenza due to the fact that she is outside of therapeutic window for antiviral therapy.  I will order a COVID antigen test.  As you can see on the image above, patient has a single erythematous lesion on the exterior rim of her left nare with some surrounding erythema and induration of the tissues.  Intranasal examination reveals multiple blistery lesions on the septum in the right nare and the left nare.  The lesions in the left nare have bloody drainage from them.  I suspect these lesions are a form of impetigo.  She has allergy to amoxicillin so I will start her on doxycycline  100 mg twice daily for 7 days along with topical mupirocin  intranasally twice daily for 5 days.  COVID antigen test is negative.   Final Clinical Impressions(s) / UC Diagnoses   Final diagnoses:  Acute cough  Impetigo     Discharge Instructions      Take the doxycycline  twice daily with food for 7 days for treatment of your impetigo.  Using a clean Q-tip for each application paint the inside of each nostril twice daily with mupirocin  for the next 5 days.  You may use over-the-counter Tylenol and/or ibuprofen as needed for any fever or pain.  If develop any new or worsening symptoms please return for reevaluation or follow-up with your primary care provider.     ED Prescriptions     Medication Sig Dispense Auth.  Provider   doxycycline  (VIBRAMYCIN ) 100 MG capsule Take 1 capsule (100 mg total) by mouth 2 (two) times daily for 7 days. 14 capsule Bernardino Ditch, NP   mupirocin  ointment (BACTROBAN ) 2 % Apply 1 Application topically 2 (two) times daily. 22 g Bernardino Ditch, NP  PDMP not reviewed this encounter.    [1]  Social History Tobacco Use   Smoking status: Never   Smokeless tobacco: Never  Vaping Use   Vaping status: Never Used  Substance Use Topics   Alcohol use: Never   Drug use: Never     Bernardino Ditch, NP 08/08/24 1455  "

## 2024-08-08 NOTE — Discharge Instructions (Signed)
 Take the doxycycline  twice daily with food for 7 days for treatment of your impetigo.  Using a clean Q-tip for each application paint the inside of each nostril twice daily with mupirocin  for the next 5 days.  You may use over-the-counter Tylenol and/or ibuprofen as needed for any fever or pain.  If develop any new or worsening symptoms please return for reevaluation or follow-up with your primary care provider.

## 2024-08-21 ENCOUNTER — Encounter: Payer: Self-pay | Admitting: Emergency Medicine
